# Patient Record
Sex: Female | Born: 1960 | Race: White | Hispanic: No | Smoking: Former smoker
Health system: Southern US, Community
[De-identification: ages and names within clinical notes are randomized; demographics above are authoritative.]

## PROBLEM LIST (undated history)

## (undated) HISTORY — PX: TUBAL LIGATION: SHX77

## (undated) HISTORY — PX: VARICOSE VEIN SURGERY: SHX832

---

## 2008-08-27 ENCOUNTER — Ambulatory Visit: Payer: Self-pay | Admitting: Family Medicine

## 2008-08-27 DIAGNOSIS — S058X9A Other injuries of unspecified eye and orbit, initial encounter: Secondary | ICD-10-CM | POA: Insufficient documentation

## 2010-10-12 ENCOUNTER — Encounter: Payer: Self-pay | Admitting: Family Medicine

## 2010-10-17 ENCOUNTER — Ambulatory Visit (INDEPENDENT_AMBULATORY_CARE_PROVIDER_SITE_OTHER): Payer: 59 | Admitting: Family Medicine

## 2010-10-17 ENCOUNTER — Encounter: Payer: Self-pay | Admitting: Family Medicine

## 2010-10-17 DIAGNOSIS — F419 Anxiety disorder, unspecified: Secondary | ICD-10-CM | POA: Insufficient documentation

## 2010-10-17 DIAGNOSIS — Z1211 Encounter for screening for malignant neoplasm of colon: Secondary | ICD-10-CM

## 2010-10-17 DIAGNOSIS — N951 Menopausal and female climacteric states: Secondary | ICD-10-CM

## 2010-10-17 DIAGNOSIS — Z1231 Encounter for screening mammogram for malignant neoplasm of breast: Secondary | ICD-10-CM

## 2010-10-17 DIAGNOSIS — F411 Generalized anxiety disorder: Secondary | ICD-10-CM

## 2010-10-17 MED ORDER — ALPRAZOLAM 0.25 MG PO TABS: 0.2500 mg | ORAL_TABLET | Freq: Every day | ORAL | Status: DC | PRN

## 2010-10-17 NOTE — Patient Instructions (Signed)
Please schedule your physical and pap smear.

## 2010-10-17 NOTE — Progress Notes (Signed)
Subjective:    Patient ID: Briana Castillo, female    DOB: April 23, 1961, 50 y.o.   MRN: [id number]  HPI Had 3 periods in a months time but now no period hasn't happened in about 5 weeks.  Having some nightsweats for months.  These are more frequent. Now getting hotflashes during the day.  Mood swings, irritability. No vaginal dryness. Sleep well. Not interested HRT. She just wants to quit having her period. She also would like to schedule physical with Pap smear. She has not had a Pap smear in the last 5 years. She has not had a mammogram since age 64. She has never had colon cancer screening.  Anxiety. She has had a prescription for Xanax in the past which she uses when necessary. She is a very small quantity about a dozen had lost her last year. She wonders if she could get a refill on this today.   Review of Systems  Constitutional: Negative for fever, diaphoresis and unexpected weight change.  HENT: Negative for hearing loss, rhinorrhea and tinnitus.   Eyes: Negative for visual disturbance.  Respiratory: Negative for cough and wheezing.   Cardiovascular: Negative for chest pain and palpitations.  Gastrointestinal: Negative for nausea, vomiting, diarrhea and blood in stool.  Genitourinary: Negative for vaginal bleeding, vaginal discharge and difficulty urinating.       Leaking urine  Musculoskeletal: Negative for myalgias and arthralgias.  Skin: Negative for rash.  Neurological: Negative for headaches.  Hematological: Negative for adenopathy. Does not bruise/bleed easily.  Psychiatric/Behavioral: Negative for sleep disturbance and dysphoric mood. The patient is nervous/anxious.    BP 128/82  Pulse 79  Ht 5\' 7"  (1.702 m)  Wt 160 lb (72.576 kg)  BMI 25.06 kg/m2    Allergies  Allergen Reactions  . Amoxicillin     REACTION: Swelling  . Penicillins     History reviewed. No pertinent past medical history.  Past Surgical History  Procedure Date  . Tubal ligation   . Varicose vein  surgery     History   Social History  . Marital Status: Divorced    Spouse Name: N/A    Number of Children: 1  . 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    Social History Main Topics  . Smoking status: Current Everyday Smoker -- 1.0 packs/day  . Smokeless tobacco: Not on file  . Alcohol Use: 0.0 oz/week    2-3 Glasses of wine per week  . Drug Use: No  . Sexually Active:      divorced, regular exercise.   Other Topics Concern  . Not on file   Social History Narrative  . No narrative on file    Family History  Problem Relation Age of Onset  . Diabetes      grandparent    Current outpatient prescriptions:ALPRAZolam (XANAX) 0.25 MG tablet, Take 1 tablet (0.25 mg total) by mouth daily as needed for anxiety., Disp: 15 tablet, Rfl: 0;  DISCONTD: sulfacetamide (BLEPH-10) 10 % ophthalmic solution, Apply 1 drop to eye every 3 (three) hours. 1-2 gtts every 3-4 hours to affected eye . , Disp: , Rfl: ;  DISCONTD: traMADol (ULTRAM) 50 MG tablet, Take 50 mg by mouth. 1-2 pills by mouth QHS as needed. For pain. , Disp: , Rfl:      Objective:   Physical Exam  Constitutional: She is oriented to person, place, and time. She appears well-developed and well-nourished.  HENT:  Head: Normocephalic and atraumatic.  Cardiovascular: Normal rate and regular rhythm.   Murmur heard.      2/6 SEM  Pulmonary/Chest: Effort normal and breath sounds normal.  Neurological: She is alert and oriented to person, place, and time.  Skin: Skin is warm and dry.  Psychiatric: She has a normal mood and affect.          Assessment & Plan:  I suspect she is probably becoming postmenopausal. She would be technically perimenopausal at this time. She is not currently interested in HRT. We did discuss the natural course of menopause.  I will make referrals for mammogram and for colonoscopy. She will come back and schedule for physical with Pap in the next  couple of weeks.

## 2010-10-17 NOTE — Assessment & Plan Note (Signed)
I did refill her Xanax for 15 tablets today. This at least last several months. I did warn her that if she finds she is using them more frequently and she needs to come in for further evaluation to see if she needs to be put on an SSRI.

## 2010-11-04 ENCOUNTER — Other Ambulatory Visit: Payer: 59

## 2010-11-04 ENCOUNTER — Telehealth: Payer: Self-pay | Admitting: *Deleted

## 2010-11-04 ENCOUNTER — Ambulatory Visit
Admission: RE | Admit: 2010-11-04 | Discharge: 2010-11-04 | Disposition: A | Discharge status: Home / Self Care | Payer: 59 | Source: Ambulatory Visit | Attending: Family Medicine | Admitting: Family Medicine

## 2010-11-04 DIAGNOSIS — Z0000 Encounter for general adult medical examination without abnormal findings: Secondary | ICD-10-CM

## 2010-11-04 DIAGNOSIS — Z1231 Encounter for screening mammogram for malignant neoplasm of breast: Secondary | ICD-10-CM

## 2010-11-04 NOTE — Telephone Encounter (Signed)
Pt came in this morning wanting lab. She is scheduled for aCPE

## 2010-11-05 LAB — LIPID PANEL
Cholesterol: 221 mg/dL — ABNORMAL HIGH (ref 0–200)
HDL: 87 mg/dL (ref >39)
LDL Cholesterol: 121 mg/dL — ABNORMAL HIGH (ref 0–99)
Total CHOL/HDL Ratio: 2.5 Ratio
Triglycerides: 64 mg/dL (ref <150)
VLDL: 13 mg/dL (ref 0–40)

## 2010-11-05 LAB — COMPLETE METABOLIC PANEL WITH GFR
ALT: 19 U/L (ref 0–35)
AST: 32 U/L (ref 0–37)
Alkaline Phosphatase: 52 U/L (ref 39–117)
BUN: 13 mg/dL (ref 6–23)
Chloride: 105 mEq/L (ref 96–112)
Creat: 0.92 mg/dL (ref 0.50–1.10)
Total Bilirubin: 0.6 mg/dL (ref 0.3–1.2)

## 2010-11-06 ENCOUNTER — Telehealth: Payer: Self-pay | Admitting: Family Medicine

## 2010-11-06 NOTE — Telephone Encounter (Signed)
Please call patient. Normal mammogram.  Repeat in 1 year.  

## 2010-11-06 NOTE — Telephone Encounter (Signed)
Pt notified that mammo normal and to repeat in 1 year.  Had to [facility]. Jarvis Newcomer, LPN [facility]

## 2010-11-12 ENCOUNTER — Telehealth: Payer: Self-pay | Admitting: Family Medicine

## 2010-11-12 NOTE — Telephone Encounter (Signed)
Call pt: CMP nl.  Chol is up a little. LDL is 121, goal is under 161. Just make sure eating low fat health diet and getting regular exercise and recheck in 1 year.

## 2010-11-13 ENCOUNTER — Encounter: Payer: Self-pay | Admitting: Family Medicine

## 2010-11-13 ENCOUNTER — Other Ambulatory Visit (HOSPITAL_COMMUNITY)
Admission: RE | Admit: 2010-11-13 | Discharge: 2010-11-13 | Disposition: A | Discharge status: Home / Self Care | Payer: 59 | Source: Ambulatory Visit | Attending: Family Medicine | Admitting: Family Medicine

## 2010-11-13 ENCOUNTER — Ambulatory Visit (INDEPENDENT_AMBULATORY_CARE_PROVIDER_SITE_OTHER): Payer: 59 | Admitting: Family Medicine

## 2010-11-13 VITALS — BP 130/85 | HR 87 | Ht 67.0 in | Wt 155.0 lb

## 2010-11-13 DIAGNOSIS — F411 Generalized anxiety disorder: Secondary | ICD-10-CM

## 2010-11-13 DIAGNOSIS — F419 Anxiety disorder, unspecified: Secondary | ICD-10-CM

## 2010-11-13 DIAGNOSIS — Z23 Encounter for immunization: Secondary | ICD-10-CM

## 2010-11-13 DIAGNOSIS — Z124 Encounter for screening for malignant neoplasm of cervix: Secondary | ICD-10-CM | POA: Insufficient documentation

## 2010-11-13 DIAGNOSIS — Z01419 Encounter for gynecological examination (general) (routine) without abnormal findings: Secondary | ICD-10-CM

## 2010-11-13 DIAGNOSIS — R87810 Cervical high risk human papillomavirus (HPV) DNA test positive: Secondary | ICD-10-CM | POA: Insufficient documentation

## 2010-11-13 DIAGNOSIS — R011 Cardiac murmur, unspecified: Secondary | ICD-10-CM

## 2010-11-13 MED ORDER — ALPRAZOLAM 0.5 MG PO TABS: 0.5000 mg | ORAL_TABLET | Freq: Every day | ORAL | Status: DC | PRN

## 2010-11-13 NOTE — Telephone Encounter (Signed)
Advised pt of result

## 2010-11-13 NOTE — Progress Notes (Signed)
  Subjective:     Briana Castillo is a 50 y.o. female and is here for a comprehensive physical exam. The patient reports no problems.  History   Social History  . Marital Status: Divorced    Spouse Name: N/A    Number of Children: 1  . 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    Social History Main Topics  . Smoking status: Current Everyday Smoker -- 1.0 packs/day    Types: Cigarettes  . Smokeless tobacco: Not on file  . Alcohol Use: 4.8 oz/week    8 Glasses of wine per week  . Drug Use: No  . Sexually Active: Not on file     divorced, regular exercise.   Other Topics Concern  . Not on file   Social History Narrative   She drinks 2-3 caffeinated beverages daily. Regular exercise.   Health Maintenance  Topic Date Due  . Pap Smear  10/10/1977  . Tetanus/tdap  10/11/1978  . Colonoscopy  10/10/2009  . Influenza Vaccine  03/02/2011  . Mammogram  11/03/2012    The following portions of the patient's history were reviewed and updated as appropriate: allergies, current medications, past family history, past medical history, past social history and past surgical history.  Review of Systems Pertinent items are noted in HPI.   Objective:    BP 130/85  Pulse 87  Ht 5\' 7"  (1.702 m)  Wt 155 lb (70.308 kg)  BMI 24.28 kg/m2 General appearance: alert, cooperative and appears stated age Head: Normocephalic, without obvious abnormality, atraumatic Eyes: conjunctiva clear, PEERLA, EOMi. Ears: normal TM's and external ear canals both ears Nose: Nares normal. Septum midline. Mucosa normal. No drainage or sinus tenderness. Throat: lips, mucosa, and tongue normal; teeth and gums normal Neck: no adenopathy, no carotid bruit, supple, symmetrical, trachea midline and thyroid not enlarged, symmetric, no tenderness/mass/nodules Back: symmetric, no curvature. ROM normal. No CVA tenderness. Lungs: clear to auscultation bilaterally Breasts: normal  appearance, no masses or tenderness Heart: systolic murmur: best herd on the right sternal border.  3/6, harsh  Abdomen: soft, non-tender; bowel sounds normal; no masses,  no organomegaly Pelvic: cervix normal in appearance, external genitalia normal, no adnexal masses or tenderness, no cervical motion tenderness, rectovaginal septum normal, uterus normal size, shape, and consistency and vagina normal without discharge Extremities: extremities normal, atraumatic, no cyanosis or edema Pulses: 2+ and symmetric Skin: Skin color, texture, turgor normal. No rashes or lesions Lymph nodes: Cervical, supraclavicular, and axillary nodes normal. Neurologic: Grossly normal    Assessment:    Healthy female exam.      Plan:     See After Visit Summary for Counseling Recommendations  Keep up the regular exercise and healthy diet.  She will schedule her colonoscopy. She has had her mammogram set up.  Tdap given today.  I reviewed her lab results with her and gave her a copy.

## 2010-11-13 NOTE — Assessment & Plan Note (Signed)
I did discuss with her that Xanax is a rescue medication should only be used when necessary. I also let her know that she finds she is using them more frequently than she needs to make an office visit as we may discuss a more prophylactic medication such as an SSRI. Patient reports that she did take citalopram were either Effexor in the past she is not sure which. She said she didn't like how she felt when she came off of the medication. I reassured her that yes these medications you need to be weaned when they were discontinued but overall they can be very helpful and don't cause such dependency benzodiazepines will do. I did refill her medications today for a 0.5 mg tablet she can break in half and use when necessary.

## 2010-11-13 NOTE — Progress Notes (Deleted)
  Subjective:    Patient ID: Briana Castillo, female    DOB: 1961/03/12, 50 y.o.   MRN: [id number]  HPI  She has the referrral for her colonoscopy.   Review of Systems     BP 130/85  Pulse 87  Ht 5\' 7"  (1.702 m)  Wt 155 lb (70.308 kg)  BMI 24.28 kg/m2    Allergies  Allergen Reactions  . Amoxicillin     REACTION: Swelling  . Penicillins     No past medical history on file.  Past Surgical History  Procedure Date  . Tubal ligation   . Varicose vein surgery     History   Social History  . Marital Status: Divorced    Spouse Name: N/A    Number of Children: 1  . 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    Social History Main Topics  . Smoking status: Current Everyday Smoker -- 1.0 packs/day    Types: Cigarettes  . Smokeless tobacco: Not on file  . Alcohol Use: 4.8 oz/week    8 Glasses of wine per week  . Drug Use: No  . Sexually Active: Not on file     divorced, regular exercise.   Other Topics Concern  . Not on file   Social History Narrative   She drinks 2-3 caffeinated beverages daily. Regular exercise.    Family History  Problem Relation Age of Onset  . Diabetes Maternal Grandmother   . Heart failure Father 78    Deceased    Current outpatient prescriptions:ALPRAZolam (XANAX) 0.25 MG tablet, Take 1 tablet (0.25 mg total) by mouth daily as needed for anxiety., Disp: 15 tablet, Rfl: 0  Objective:   Physical Exam  Constitutional: She appears well-developed and well-nourished.  HENT:  Head: Normocephalic and atraumatic.  Right Ear: External ear normal.  Mouth/Throat: Oropharynx is clear and moist.          Assessment & Plan:

## 2010-11-13 NOTE — Progress Notes (Signed)
  Subjective:    Patient ID: Briana Castillo, female    DOB: Mar 18, 1961, 50 y.o.   MRN: [id number]  HPI She would also like to get a 90 day supply on her Xanax. She would like me to increase the dose that she can use a half of a tab when necessary. At last time I gave her 15 tablets and she said that these were too small to cut in half. She says she is not using them daily. She only uses them when she feels overwhelmed.  She would like another something over-the-counter or other supplement that she can take for her menopausal symptoms.   Review of Systems     Objective:   Physical Exam        Assessment & Plan:  Menopausal symptoms-I recommended black cohosh evening primrose oil. If this is not enough then we could certainly consider an SSRI or Effexor to help control her hot flashes and her mood and irritability changes. She says she is not interested in a prescription medication at this time.

## 2010-11-13 NOTE — Progress Notes (Signed)
  Subjective:    Patient ID: Briana Castillo, female    DOB: 15-Nov-1960, 50 y.o.   MRN: [id number]  HPI    Review of Systems     Objective:   Physical Exam    corrections a physical exam. She does have a 2-3/6 systolic ejection murmur best heard at the right sternal border.    Assessment & Plan:

## 2010-11-20 ENCOUNTER — Telehealth: Payer: Self-pay | Admitting: Family Medicine

## 2010-11-20 NOTE — Telephone Encounter (Signed)
Call pt: normal pap.  Repeat in 2 years.

## 2010-11-21 NOTE — Telephone Encounter (Signed)
Pt informed of normal pap smear.  Told to repeat in 2 yrs. Jarvis Newcomer, LPN [facility]

## 2011-02-20 LAB — HM COLONOSCOPY

## 2011-06-30 ENCOUNTER — Ambulatory Visit (INDEPENDENT_AMBULATORY_CARE_PROVIDER_SITE_OTHER): Payer: BC Managed Care – PPO | Admitting: Family Medicine

## 2011-06-30 ENCOUNTER — Encounter: Payer: Self-pay | Admitting: Family Medicine

## 2011-06-30 VITALS — BP 145/81 | HR 70 | Temp 98.2°F | Wt 161.0 lb

## 2011-06-30 DIAGNOSIS — N951 Menopausal and female climacteric states: Secondary | ICD-10-CM

## 2011-06-30 DIAGNOSIS — F411 Generalized anxiety disorder: Secondary | ICD-10-CM

## 2011-06-30 DIAGNOSIS — R011 Cardiac murmur, unspecified: Secondary | ICD-10-CM

## 2011-06-30 DIAGNOSIS — F419 Anxiety disorder, unspecified: Secondary | ICD-10-CM

## 2011-06-30 DIAGNOSIS — I781 Nevus, non-neoplastic: Secondary | ICD-10-CM

## 2011-06-30 MED ORDER — VENLAFAXINE HCL ER 37.5 MG PO CP24: 37.5000 mg | ORAL_CAPSULE | Freq: Every day | ORAL | Status: DC

## 2011-06-30 MED ORDER — ALPRAZOLAM 0.5 MG PO TABS: 0.5000 mg | ORAL_TABLET | Freq: Every day | ORAL | Status: DC | PRN

## 2011-06-30 NOTE — Progress Notes (Signed)
Subjective:    Patient ID: Briana Castillo, female    DOB: 01-Apr-1961, 51 y.o.   MRN: [id number]  HPI Here to discuss menopause. She has been having night sweats and mood swings and irregular periods. They have been heavier and more frequent over the last 2 years. She denies any daytime hot flashes. She feels her sleep is fair except for what is may be disrupted by the night sweats which is not every night. She has a positive history of tobacco use. She's been currently using black cohosh to help with her symptoms. She says it helps some. She does workout regularly which is good. She also notes an inability to focus.  Spider veins-18 years ago she had vein stripping and sclerotherapy. In the last year she's noticed more discomfort and aching and swelling in her lower legs. She doesn't occasional cramping. She is not currently using wearing compression stockings. She also notes occasional stinging pain. No worsening or alleviating symptoms at this time.  Murmur-she says she was seen at an urgent care about a month ago and was told that they recommended she see cardiology. When she first came to me we detect a murmur but she says it had been there for some time and that her previous physician had noted the same thing. She says she's never had an evaluation her echocardiogram for the murmur. She denies any chest pain or shortness of breath or family history of abnormal valve diseases.  Anxiety-she continues to use her Xanax when necessary, not daily basis and is doing well. She would like a refill.  Review of Systems BP 145/81  Pulse 70  Temp(Src) 98.2 F (36.8 C) (Oral)  Wt 161 lb (73.029 kg)  SpO2 97%    Allergies  Allergen Reactions  . Amoxicillin     REACTION: Swelling  . Penicillins     No past medical history on file.  Past Surgical History  Procedure Date  . Tubal ligation   . Varicose vein surgery     History   Social History  . Marital Status: Divorced    Spouse Name: N/A    Number of Children: 1  . 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    Social History Main Topics  . Smoking status: Current Everyday Smoker -- 1.0 packs/day    Types: Cigarettes  . Smokeless tobacco: Not on file  . Alcohol Use: 4.8 oz/week    8 Glasses of wine per week  . Drug Use: No  . Sexually Active: Not on file     divorced, regular exercise.   Other Topics Concern  . Not on file   Social History Narrative   She drinks 2-3 caffeinated beverages daily. Regular exercise.    Family History  Problem Relation Age of Onset  . Diabetes Maternal Grandmother   . Heart failure Father 42    Deceased       Objective:   Physical Exam  Constitutional: She is oriented to person, place, and time. She appears well-developed and well-nourished.  HENT:  Head: Normocephalic and atraumatic.  Cardiovascular: Normal rate and regular rhythm.        4/6 systolic ejection murmur best heard at the right midsternal border.  Pulmonary/Chest: Effort normal and breath sounds normal.  Neurological: She is alert and oriented to person, place, and time.  Skin: Skin is warm and dry.       She does have a lot  of spider veins from her mid tibia down to her ankles on both legs. Some trace edema. A few varicose veins. None are bulging.   Psychiatric: She has a normal mood and affect. Her behavior is normal.          Assessment & Plan:  Perimenopausal symptoms-we discussed different options which includes doing nothing, versus over-the-counter treatment such as black cohosh and evening primrose oil, versus prescription medications including HRT and non-HRT products. She is at high risk because of her tobacco abuse with blood clots with HRT. We discussed another product such as Effexor may be beneficial for her to help control her symptoms. She is already using the over-the-counter black cohosh. I discussed that it will help with some of her menopausal  symptoms but not with her frequent heavy periods. I did recommend that she consider seeing her GYN to discuss this. She is over 35 she might warrant an endometrial biopsy to rule out endometrial hyperplasia. After discussing the options she did decide she wants to start the Effexor. We discussed the potential side effects. Follow up in one month. She says she knows she needs to quit smoking and has been working on weaning down.  Spider veins-the refer her to the [facility] in [place]. I discussed the typical treatment is typically wearing compression stockings for some time to see if it improves her symptoms and also to help prevent future varicose veins and spider veins. She would like to wait so she has her first appointment with them.  Systolic ejection murmur-because it seems louder today than when I last saw her I do recommend evaluation with an echocardiogram. Depending on the results she may need to see a cardiologist. Patient is somewhat concerned about the cost because she has a high deductible this year. Next  Elevated blood pressure-her blood pressure is up a little bit today. She is coming back in 1 month we will recheck it then.

## 2011-07-09 ENCOUNTER — Other Ambulatory Visit (HOSPITAL_COMMUNITY): Payer: BC Managed Care – PPO | Admitting: Radiology

## 2011-08-11 DIAGNOSIS — I83819 Varicose veins of unspecified lower extremities with pain: Secondary | ICD-10-CM | POA: Insufficient documentation

## 2011-12-08 DIAGNOSIS — I872 Venous insufficiency (chronic) (peripheral): Secondary | ICD-10-CM | POA: Insufficient documentation

## 2012-01-13 ENCOUNTER — Encounter: Payer: Self-pay | Admitting: Family Medicine

## 2012-01-13 ENCOUNTER — Other Ambulatory Visit (HOSPITAL_COMMUNITY)
Admission: RE | Admit: 2012-01-13 | Discharge: 2012-01-13 | Disposition: A | Discharge status: Home / Self Care | Payer: BC Managed Care – PPO | Source: Ambulatory Visit | Attending: Family Medicine | Admitting: Family Medicine

## 2012-01-13 ENCOUNTER — Ambulatory Visit (INDEPENDENT_AMBULATORY_CARE_PROVIDER_SITE_OTHER): Payer: BC Managed Care – PPO | Admitting: Family Medicine

## 2012-01-13 VITALS — BP 143/95 | HR 74 | Ht 67.0 in | Wt 171.0 lb

## 2012-01-13 DIAGNOSIS — Z1231 Encounter for screening mammogram for malignant neoplasm of breast: Secondary | ICD-10-CM

## 2012-01-13 DIAGNOSIS — Z720 Tobacco use: Secondary | ICD-10-CM

## 2012-01-13 DIAGNOSIS — F17200 Nicotine dependence, unspecified, uncomplicated: Secondary | ICD-10-CM

## 2012-01-13 DIAGNOSIS — Z23 Encounter for immunization: Secondary | ICD-10-CM

## 2012-01-13 DIAGNOSIS — Z01419 Encounter for gynecological examination (general) (routine) without abnormal findings: Secondary | ICD-10-CM | POA: Insufficient documentation

## 2012-01-13 LAB — LIPID PANEL
LDL Cholesterol: 99 mg/dL (ref 0–99)
Triglycerides: 75 mg/dL (ref <150)
VLDL: 15 mg/dL (ref 0–40)

## 2012-01-13 LAB — COMPLETE METABOLIC PANEL WITH GFR
ALT: 15 U/L (ref 0–35)
AST: 20 U/L (ref 0–37)
Albumin: 4 g/dL (ref 3.5–5.2)
CO2: 28 mEq/L (ref 19–32)
Calcium: 9.1 mg/dL (ref 8.4–10.5)
Chloride: 107 mEq/L (ref 96–112)
Creat: 0.83 mg/dL (ref 0.50–1.10)
GFR, Est African American: >89 mL/min
Potassium: 4.5 mEq/L (ref 3.5–5.3)

## 2012-01-13 MED ORDER — ALPRAZOLAM 0.5 MG PO TABS: 0.5000 mg | ORAL_TABLET | Freq: Every day | ORAL | Status: DC | PRN

## 2012-01-13 NOTE — Patient Instructions (Addendum)
Start a regular exercise program and make sure you are eating a healthy diet Try to eat 4 servings of dairy a day  Your vaccines are up to date.   

## 2012-01-13 NOTE — Progress Notes (Signed)
Subjective:     Briana Castillo is a 51 y.o. female and is here for a comprehensive physical exam. The patient reports problems - Says her last period was 4 months ago and knows she is post menopausal. Says she is irritable, has gained weight, having difficouly focusing, low energy.  Marland Kitchen  History   Social History  . Marital Status: Divorced    Spouse Name: N/A    Number of Children: 1  . 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    Social History Main Topics  . Smoking status: Current Everyday Smoker -- 1.0 packs/day    Types: Cigarettes  . Smokeless tobacco: Not on file  . Alcohol Use: 4.8 oz/week    8 Glasses of wine per week  . Drug Use: No  . Sexually Active: Not on file     divorced, regular exercise.   Other Topics Concern  . Not on file   Social History Narrative   She drinks 2-3 caffeinated beverages daily. Regular exercise.   Health Maintenance  Topic Date Due  . Influenza Vaccine  03/01/2012  . Mammogram  11/03/2012  . Pap Smear  01/12/2014  . Tetanus/tdap  11/12/2020  . Colonoscopy  01/30/2021    The following portions of the patient's history were reviewed and updated as appropriate: allergies, current medications, past family history, past medical history, past social history, past surgical history and problem list.  Review of Systems A comprehensive review of systems was negative.   Objective:    BP 143/95  Pulse 74  Ht 5\' 7"  (1.702 m)  Wt 171 lb (77.565 kg)  BMI 26.78 kg/m2  SpO2 98%  LMP 09/13/2011 General appearance: alert, cooperative and appears stated age Head: Normocephalic, without obvious abnormality, atraumatic Eyes: conj clear, EOMI, PEERLA Ears: normal TM's and external ear canals both ears Nose: Nares normal. Septum midline. Mucosa normal. No drainage or sinus tenderness. Throat: lips, mucosa, and tongue normal; teeth and gums normal Neck: no adenopathy, no carotid bruit, supple, symmetrical,  trachea midline and thyroid not enlarged, symmetric, no tenderness/mass/nodules Back: symmetric, no curvature. ROM normal. No CVA tenderness. Lungs: clear to auscultation bilaterally Breasts: normal appearance, no masses or tenderness Heart: regular rate and rhythm, S1, S2 normal, no murmur, click, rub or gallop Abdomen: soft, non-tender; bowel sounds normal; no masses,  no organomegaly Pelvic: cervix normal in appearance, external genitalia normal, no adnexal masses or tenderness, no cervical motion tenderness, rectovaginal septum normal, uterus normal size, shape, and consistency and vagina normal without discharge Extremities: extremities normal, atraumatic, no cyanosis or edema Pulses: 2+ and symmetric Skin: Skin color, texture, turgor normal. No rashes or lesions Lymph nodes: Cervical, supraclavicular, and axillary nodes normal. Neurologic: Grossly normal    Assessment:    Healthy female exam.      Plan:     See After Visit Summary for Counseling Recommendations  Start a regular exercise program and make sure you are eating a healthy diet Try to eat 4 servings of dairy a day  Your vaccines are up to date Due for screening labs.  Menopause - she never actually started the Effexor. We discussed again. She's not a candidate for hormone replacement therapy because she's a smoker. She says she will think about it. I did encourage her to followup if she decides to start it.  Tobacco abuse-discussed cessation. Handout given on tips. She know she needs to quit but she is not quite  ready.  Pneumonia vaccine given.  Discussed wellbutrin vx chantix.  She wants to read more about them and see what she wants to do.

## 2012-01-22 ENCOUNTER — Ambulatory Visit (HOSPITAL_BASED_OUTPATIENT_CLINIC_OR_DEPARTMENT_OTHER)
Admission: RE | Admit: 2012-01-22 | Discharge: 2012-01-22 | Disposition: A | Discharge status: Home / Self Care | Payer: BC Managed Care – PPO | Source: Ambulatory Visit | Attending: Family Medicine | Admitting: Family Medicine

## 2012-01-22 DIAGNOSIS — Z1231 Encounter for screening mammogram for malignant neoplasm of breast: Secondary | ICD-10-CM | POA: Insufficient documentation

## 2012-04-22 ENCOUNTER — Other Ambulatory Visit: Payer: Self-pay | Admitting: *Deleted

## 2012-04-22 ENCOUNTER — Other Ambulatory Visit: Payer: Self-pay | Admitting: Family Medicine

## 2012-06-02 ENCOUNTER — Other Ambulatory Visit: Payer: Self-pay | Admitting: Family Medicine

## 2012-09-27 ENCOUNTER — Other Ambulatory Visit: Payer: Self-pay | Admitting: *Deleted

## 2012-09-27 ENCOUNTER — Other Ambulatory Visit: Payer: Self-pay | Admitting: Family Medicine

## 2012-09-27 MED ORDER — ALPRAZOLAM 0.5 MG PO TABS: 0.5000 mg | ORAL_TABLET | Freq: Every day | ORAL | Status: DC | PRN

## 2013-01-13 ENCOUNTER — Encounter: Payer: Self-pay | Admitting: Family Medicine

## 2013-01-13 ENCOUNTER — Ambulatory Visit (INDEPENDENT_AMBULATORY_CARE_PROVIDER_SITE_OTHER): Payer: BC Managed Care – PPO | Admitting: Family Medicine

## 2013-01-13 VITALS — BP 136/89 | HR 72 | Ht 67.0 in | Wt 187.0 lb

## 2013-01-13 DIAGNOSIS — R011 Cardiac murmur, unspecified: Secondary | ICD-10-CM

## 2013-01-13 DIAGNOSIS — H938X9 Other specified disorders of ear, unspecified ear: Secondary | ICD-10-CM

## 2013-01-13 DIAGNOSIS — R5383 Other fatigue: Secondary | ICD-10-CM

## 2013-01-13 DIAGNOSIS — H938X1 Other specified disorders of right ear: Secondary | ICD-10-CM

## 2013-01-13 DIAGNOSIS — Z1231 Encounter for screening mammogram for malignant neoplasm of breast: Secondary | ICD-10-CM

## 2013-01-13 DIAGNOSIS — R5381 Other malaise: Secondary | ICD-10-CM

## 2013-01-13 DIAGNOSIS — Z0000 Encounter for general adult medical examination without abnormal findings: Secondary | ICD-10-CM

## 2013-01-13 MED ORDER — ALPRAZOLAM 0.5 MG PO TABS: 0.5000 mg | ORAL_TABLET | Freq: Every day | ORAL | Status: DC | PRN

## 2013-01-13 NOTE — Progress Notes (Signed)
Subjective:     Briana Castillo is a 52 y.o. female and is here for a comprehensive physical exam. The patient reports problems - Says she has gained 30 lbs in the last year.  Her last periods was 1.5 years ago. She is now menopausal. Has low energy and lack of focus.  Starting having floaters in her left eye backin teh spring. Went to eye doctor and told eye exam was normal.    Pressure on and off in her right ear. No pain or discharge but pressure.  No hearing loss. No trauma.    Wants to discuss weigh tloss medications. She has several friends who've been doing injections and hasn't really noticed some significant results with weight loss. She's interested in discussing this today. She has been trying to take some B12 sublingual to see if this would help. Admits she has not been exercising at all. She says she just feels too fatigued and unmotivated. She says sometimes she'll even dropped to the gym, parking, and then turn around and not go inside.   She also complains of bilateral heel pain. She has a history of heel spurs. She had injections in about 8-9 years ago responded really well to this. The pain started up again about a year ago. She's been trying to just do conservative therapy with stretches, and confirmatory, icing the area. She was having which is eventually get better. It has not. She is thinking about doing the injections again. She says now she's also experiencing knee pain and think she may have a lot of swelling in both knees. She's not sure if it's from the weight gain or something else. She says now she's also having pain in the ball of her foot.  History   Social History  . Marital Status: Divorced    Spouse Name: N/A    Number of Children: 1  . 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    Social History Main Topics  . Smoking status: Current Every Day Smoker -- 1.00 packs/day    Types: Cigarettes  . Smokeless tobacco:  Not on file  . Alcohol Use: 4.8 oz/week    8 Glasses of wine per week  . Drug Use: No  . Sexual Activity: Not on file     Comment: divorced, regular exercise.   Other Topics Concern  . Not on file   Social History Narrative   She drinks 2-3 caffeinated beverages daily. Regular exercise.   Health Maintenance  Topic Date Due  . Influenza Vaccine  01/30/2013  . Pap Smear  01/12/2014  . Mammogram  01/21/2014  . Tetanus/tdap  11/12/2020  . Colonoscopy  01/30/2021    The following portions of the patient's history were reviewed and updated as appropriate: allergies, current medications, past family history, past medical history, past social history, past surgical history and problem list.  Review of Systems A comprehensive review of systems was negative.   Objective:    BP 136/89  Pulse 72  Ht 5\' 7"  (1.702 m)  Wt 187 lb (84.823 kg)  BMI 29.28 kg/m2 General appearance: alert, cooperative and appears stated age Head: Normocephalic, without obvious abnormality, atraumatic Eyes: conj clear, EOMi, PEERLA Ears: normal TM's and external ear canals both ears Nose: Nares normal. Septum midline. Mucosa normal. No drainage or sinus tenderness. Throat: lips, mucosa, and tongue normal; teeth and gums normal Neck: no adenopathy, no carotid bruit, no JVD, supple, symmetrical,  trachea midline and thyroid not enlarged, symmetric, no tenderness/mass/nodules Back: symmetric, no curvature. ROM normal. No CVA tenderness. Lungs: clear to auscultation bilaterally Breasts: normal appearance, no masses or tenderness Heart: regular rate and rhythm and systolic murmur: holosystolic 3/6, right sternal border radiates to carotids Abdomen: soft, non-tender; bowel sounds normal; no masses,  no organomegaly Extremities: extremities normal, atraumatic, no cyanosis or edema Pulses: 2+ and symmetric Skin: Skin color, texture, turgor normal. No rashes or lesions Lymph nodes: Cervical, supraclavicular, and  axillary nodes normal. Neurologic: Alert and oriented X 3, normal strength and tone. Normal symmetric reflexes. Normal coordination and gait    Assessment:    Healthy female exam.      Plan:     See After Visit Summary for Counseling Recommendations  Keep up a regular exercise program and make sure you are eating a healthy diet Try to eat 4 servings of dairy a day, or if you are lactose intolerant take a calcium with vitamin D daily.  Your vaccines are up to date.   Right TM retracted - tympanogram performed. There was a high peak height on the tympanogram on the right year. Thus I would like to refer to ENT for further evaluation.  Obesity -we discussed different treatment options. Of course diet and exercise are absolutely key. She says she knows what she should do as far as diet is concerned. She just feels unmotivated as far as exercise. I will check a thyroid level just to rule hypothyroidism since she has gained a large amount of weight and a short period time. I discussed with her that we do not do hCG injections currently.  Bilat foot pain - history of bilateral heel spurs. Since she has failed conservative therapy at this point I recommend she see my partner Dr. Maisie Fus. For now continue stetches, NSAIDs, icing.   [facility] for further evaluation and treatment. I think she would benefit greatly from injections and probably custom orthotics.  Bilateral knee pain-my partner Dr. Rodney Langton can take a look at this as well. I do think she has some trace swelling in the left knee.

## 2013-01-17 LAB — VITAMIN B12: Vitamin B-12: 1664 pg/mL — ABNORMAL HIGH (ref 211–911)

## 2013-01-17 LAB — COMPLETE METABOLIC PANEL WITH GFR
ALT: 20 U/L (ref 0–35)
CO2: 25 mEq/L (ref 19–32)
Calcium: 9.2 mg/dL (ref 8.4–10.5)
Chloride: 108 mEq/L (ref 96–112)
GFR, Est African American: 90 mL/min
Sodium: 142 mEq/L (ref 135–145)
Total Protein: 6.6 g/dL (ref 6.0–8.3)

## 2013-01-17 LAB — LIPID PANEL
HDL: 77 mg/dL (ref >39)
LDL Cholesterol: 104 mg/dL — ABNORMAL HIGH (ref 0–99)

## 2013-01-17 LAB — CBC
HCT: 39.1 % (ref 36.0–46.0)
Hemoglobin: 13.4 g/dL (ref 12.0–15.0)
RDW: 14.8 % (ref 11.5–15.5)
WBC: 6 10*3/uL (ref 4.0–10.5)

## 2013-01-17 LAB — FERRITIN: Ferritin: 31 ng/mL (ref 10–291)

## 2013-01-23 ENCOUNTER — Encounter: Payer: Self-pay | Admitting: Family Medicine

## 2013-01-23 ENCOUNTER — Ambulatory Visit (HOSPITAL_BASED_OUTPATIENT_CLINIC_OR_DEPARTMENT_OTHER)
Admission: RE | Admit: 2013-01-23 | Discharge: 2013-01-23 | Disposition: A | Discharge status: Home / Self Care | Payer: BC Managed Care – PPO | Source: Ambulatory Visit | Attending: Family Medicine | Admitting: Family Medicine

## 2013-01-23 DIAGNOSIS — Z1231 Encounter for screening mammogram for malignant neoplasm of breast: Secondary | ICD-10-CM | POA: Insufficient documentation

## 2013-02-03 ENCOUNTER — Other Ambulatory Visit: Payer: Self-pay | Admitting: Family Medicine

## 2013-02-03 ENCOUNTER — Telehealth: Payer: Self-pay | Admitting: *Deleted

## 2013-02-03 DIAGNOSIS — R635 Abnormal weight gain: Secondary | ICD-10-CM

## 2013-02-03 DIAGNOSIS — E039 Hypothyroidism, unspecified: Secondary | ICD-10-CM

## 2013-02-03 NOTE — Telephone Encounter (Signed)
2D echo prior auth # [id number]. [facility] notified. Barry Dienes, LPN

## 2013-02-19 ENCOUNTER — Telehealth: Payer: Self-pay | Admitting: Family Medicine

## 2013-02-19 NOTE — Telephone Encounter (Signed)
Call pt: I have her echo results back. Overall things are ok but I want to go over results with her. Have her schedule appt

## 2013-02-21 NOTE — Telephone Encounter (Signed)
Pt informed she will call for an appt next week.Loralee Pacas Lykens

## 2013-02-23 ENCOUNTER — Ambulatory Visit (INDEPENDENT_AMBULATORY_CARE_PROVIDER_SITE_OTHER): Payer: BC Managed Care – PPO | Admitting: Family Medicine

## 2013-02-23 ENCOUNTER — Encounter: Payer: Self-pay | Admitting: Family Medicine

## 2013-02-23 VITALS — BP 152/90 | HR 60 | Wt 186.0 lb

## 2013-02-23 DIAGNOSIS — R635 Abnormal weight gain: Secondary | ICD-10-CM

## 2013-02-23 DIAGNOSIS — M25561 Pain in right knee: Secondary | ICD-10-CM

## 2013-02-23 DIAGNOSIS — R011 Cardiac murmur, unspecified: Secondary | ICD-10-CM

## 2013-02-23 DIAGNOSIS — M25579 Pain in unspecified ankle and joints of unspecified foot: Secondary | ICD-10-CM

## 2013-02-23 DIAGNOSIS — M25569 Pain in unspecified knee: Secondary | ICD-10-CM

## 2013-02-23 MED ORDER — PHENTERMINE HCL 37.5 MG PO CAPS: 37.5000 mg | ORAL_CAPSULE | ORAL | Status: DC

## 2013-02-23 NOTE — Progress Notes (Signed)
Subjective:    Patient ID: Briana Castillo, female    DOB: 1960/11/19, 52 y.o.   MRN: [id number]  HPI Here to go over echo results.  She came in for a physical and wanted to discuss her weight gain. She gained about 30 pounds over the last year. I did hear a murmur on exam and it was hesitant about starting any medication until we had an echocardiogram to make sure that her heart was okay to potentially start a stimulant. TSH wasn't run on labs. Has been working out for 6 weeks adn has only lost 1 lb.  she has several friends that have been going to local weight loss clinic in taking phentermine as well as getting hCG injections. She asked me about this last time and I explained her that I do not do the hCG injections but with a we could consider phentermine as long as I knew that her heart was helping her blood pressure was under good control. She said she decided not to followup with Dr. to Dr. Benjamin Stain for her knee pain and foot pain because when she went online to read about her conditions she read that weight loss usually makes a significant improvement in these symptoms.   Review of Systems  BP 152/90  Pulse 60  Wt 186 lb (84.369 kg)  BMI 29.12 kg/m2    Allergies  Allergen Reactions  . Amoxicillin Shortness Of Breath and Rash    REACTION: Swelling  . Penicillins Shortness Of Breath and Rash    No past medical history on file.  Past Surgical History  Procedure Laterality Date  . Tubal ligation    . Varicose vein surgery      x 2    History   Social History  . Marital Status: Divorced    Spouse Name: N/A    Number of Children: 1  . 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    Social History Main Topics  . Smoking status: Current Every Day Smoker -- 1.00 packs/day    Types: Cigarettes  . Smokeless tobacco: Not on file  . Alcohol Use: 4.8 oz/week    8 Glasses of wine per week  . Drug Use: No  . Sexual Activity: Not on  file     Comment: divorced, regular exercise.   Other Topics Concern  . Not on file   Social History Narrative   She drinks 2-3 caffeinated beverages daily. Regular exercise.    Family History  Problem Relation Age of Onset  . Diabetes Maternal Grandmother   . Heart failure Father 27    Deceased    Outpatient Encounter Prescriptions as of 02/23/2013  Medication Sig Dispense Refill  . ALPRAZolam (XANAX) 0.5 MG tablet Take 1 tablet (0.5 mg total) by mouth daily as needed for anxiety.  45 tablet  0  . phentermine 37.5 MG capsule Take 1 capsule (37.5 mg total) by mouth every morning.  30 capsule  0   No facility-administered encounter medications on file as of 02/23/2013.          Objective:   Physical Exam  Constitutional: She appears well-developed and well-nourished.  Skin: Skin is warm and dry.  Psychiatric: She has a normal mood and affect. Her behavior is normal.          Assessment & Plan:  Abnormal weight gain - we discussed different options. I think as far as her heart is concerned  she is okay to take a stimulant as well as her blood pressures under good control. We discussed the potential side effects of the medication and she is to stop immediately if she expresses any chest pain or shortness of breath. She would need to followup monthly for blood pressure and weight checks. She says she will decide if she wants to do it here or if she wants to go to the local weight loss clinic. Unfortunately the thyroid level was never drawn. A lab slip did not have a bar code. I reprinted a new slip today and encouraged her to try to go today.  Bilateral knee pain/foot pain-I discussed that I still would encourage her to see my partner for further evaluation and treatment. I think if he can provide her some pain relief and that will allow her to exercise more normally which will in turn allow her to lose weight which is ultimately her goal.  Echo - reviewed echo results with her  and gave her a copy. She did have some mitral valve as well as tricuspid valve and aortic valve regurgitation but no other sign of significant disease ejection fraction was 60-65% which is in the normal range. Did discuss the importance of controlling blood pressure. Her pressure was a little bit elevated today but she was very worried and stressed about her results.  Time spent 25 min, >50% spent counseling about weight gain, heart murmur and knee pain/foot pain.

## 2013-02-27 ENCOUNTER — Telehealth: Payer: Self-pay | Admitting: *Deleted

## 2013-02-27 MED ORDER — PHENTERMINE HCL 37.5 MG PO CAPS: 37.5000 mg | ORAL_CAPSULE | ORAL | Status: DC

## 2013-02-27 NOTE — Telephone Encounter (Signed)
Pt has LM stating that she has lost the rx for the Phentermine you gave her at her appt. She is asking for another rx.  Meyer Cory, LPN

## 2013-02-27 NOTE — Telephone Encounter (Signed)
Just to verify with pharmacy that it was not filled. If it was not, then ok to resend new one.

## 2013-02-27 NOTE — Telephone Encounter (Signed)
[facility]. Reprinted rx.  Meyer Cory, LPN

## 2013-03-02 ENCOUNTER — Telehealth: Payer: Self-pay | Admitting: *Deleted

## 2013-03-02 ENCOUNTER — Other Ambulatory Visit: Payer: Self-pay | Admitting: *Deleted

## 2013-03-02 MED ORDER — PHENTERMINE HCL 37.5 MG PO TABS: 37.5000 mg | ORAL_TABLET | Freq: Every day | ORAL | Status: DC

## 2013-03-02 NOTE — Telephone Encounter (Signed)
It does come in a 37.5 mg tablet. She usually more expensive than the capsules but if she would like to have the tablets and over them that is fine. Okay to her resent prescription.

## 2013-03-02 NOTE — Telephone Encounter (Signed)
Pt left vm stating that you sent in capsules of phentermine but she prefers tablets that she can cut in half.  Are these available? If so can we send those in? Please advise

## 2013-04-18 DIAGNOSIS — I781 Nevus, non-neoplastic: Secondary | ICD-10-CM | POA: Insufficient documentation

## 2014-03-28 ENCOUNTER — Other Ambulatory Visit: Payer: Self-pay | Admitting: Family Medicine

## 2014-03-28 ENCOUNTER — Ambulatory Visit (INDEPENDENT_AMBULATORY_CARE_PROVIDER_SITE_OTHER): Payer: BC Managed Care – PPO

## 2014-03-28 ENCOUNTER — Encounter: Payer: Self-pay | Admitting: Family Medicine

## 2014-03-28 ENCOUNTER — Other Ambulatory Visit (HOSPITAL_COMMUNITY)
Admission: RE | Admit: 2014-03-28 | Discharge: 2014-03-28 | Disposition: A | Discharge status: Home / Self Care | Payer: BC Managed Care – PPO | Source: Ambulatory Visit | Attending: Family Medicine | Admitting: Family Medicine

## 2014-03-28 ENCOUNTER — Telehealth: Payer: Self-pay | Admitting: Family Medicine

## 2014-03-28 ENCOUNTER — Ambulatory Visit (INDEPENDENT_AMBULATORY_CARE_PROVIDER_SITE_OTHER): Payer: BC Managed Care – PPO | Admitting: Family Medicine

## 2014-03-28 VITALS — BP 138/94 | HR 72 | Ht 67.0 in | Wt 198.0 lb

## 2014-03-28 DIAGNOSIS — Z1151 Encounter for screening for human papillomavirus (HPV): Secondary | ICD-10-CM | POA: Insufficient documentation

## 2014-03-28 DIAGNOSIS — Z0000 Encounter for general adult medical examination without abnormal findings: Secondary | ICD-10-CM | POA: Diagnosis not present

## 2014-03-28 DIAGNOSIS — Z01419 Encounter for gynecological examination (general) (routine) without abnormal findings: Secondary | ICD-10-CM | POA: Insufficient documentation

## 2014-03-28 DIAGNOSIS — Z1231 Encounter for screening mammogram for malignant neoplasm of breast: Secondary | ICD-10-CM

## 2014-03-28 DIAGNOSIS — Z124 Encounter for screening for malignant neoplasm of cervix: Secondary | ICD-10-CM | POA: Diagnosis not present

## 2014-03-28 MED ORDER — ESTRADIOL-NORETHINDRONE ACET 0.5-0.1 MG PO TABS: 1.0000 | ORAL_TABLET | Freq: Every day | ORAL | Status: DC

## 2014-03-28 MED ORDER — ALPRAZOLAM 0.5 MG PO TABS: 0.5000 mg | ORAL_TABLET | Freq: Every day | ORAL | Status: DC | PRN

## 2014-03-28 NOTE — Progress Notes (Signed)
Subjective:     Briana Castillo is a 53 y.o. female and is here for a comprehensive physical exam. The patient reports problems - weight gain, hot flahses. She is 2 year s post menopausal and just feels miserable.  No sleeping well, Irritable. Marland Kitchen.  History   Social History  . Marital Status: Divorced    Spouse Name: N/A    Number of Children: 1  . 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    Social History Main Topics  . Smoking status: Current Some Day Smoker -- 1.00 packs/day    Types: Cigarettes  . Smokeless tobacco: Not on file  . Alcohol Use: 4.8 oz/week    8 Glasses of wine per week  . Drug Use: No  . Sexual Activity: Not on file     Comment: divorced, regular exercise.   Other Topics Concern  . Not on file   Social History Narrative   She drinks 2-3 caffeinated beverages daily. Regular exercise.   Health Maintenance  Topic Date Due  . Influenza Vaccine  12/30/2013  . Pap Smear  01/12/2014  . Mammogram  01/24/2015  . Tetanus/tdap  11/12/2020  . Colonoscopy  01/30/2021    The following portions of the patient's history were reviewed and updated as appropriate: allergies, current medications, past family history, past medical history, past social history, past surgical history and problem list.  Review of Systems A comprehensive review of systems was negative.   Objective:    BP 138/94  Pulse 72  Ht 5\' 7"  (1.702 m)  Wt 198 lb (89.812 kg)  BMI 31.00 kg/m2 General appearance: alert, cooperative and appears stated age Head: Normocephalic, without obvious abnormality, atraumatic Eyes: conj clear, EOMI, PEERLA Ears: normal TM's and external ear canals both ears Nose: Nares normal. Septum midline. Mucosa normal. No drainage or sinus tenderness. Throat: lips, mucosa, and tongue normal; teeth and gums normal Neck: no adenopathy, no carotid bruit, no JVD, supple, symmetrical, trachea midline and thyroid not enlarged,  symmetric, no tenderness/mass/nodules Back: symmetric, no curvature. ROM normal. No CVA tenderness. Lungs: clear to auscultation bilaterally Breasts: normal appearance, no masses or tenderness Heart: regular rate and rhythm, S1, S2 normal, no murmur, click, rub or gallop Abdomen: soft, non-tender; bowel sounds normal; no masses,  no organomegaly Pelvic: cervix normal in appearance, external genitalia normal, no adnexal masses or tenderness, no cervical motion tenderness, rectovaginal septum normal, uterus normal size, shape, and consistency and vagina normal without discharge Extremities: extremities normal, atraumatic, no cyanosis or edema Pulses: 2+ and symmetric Skin: Skin color, texture, turgor normal. No rashes or lesions Lymph nodes: Cervical, supraclavicular, and axillary nodes normal. Neurologic: Alert and oriented X 3, normal strength and tone. Normal symmetric reflexes. Normal coordination and gait    Assessment:    Healthy female exam.      Plan:     See After Visit Summary for Counseling Recommendations  Keep up a regular exercise program and make sure you are eating a healthy diet Try to eat 4 servings of dairy a day, or if you are lactose intolerant take a calcium with vitamin D daily.  Your vaccines are up to date.   HRT - we discussed the pros and cons of hormone replacement therapy. She does have a uterus that she would need to be on combination therapy. We discussed increased risk of breast cancer as well as heart disease. She has no family history of breast  cancer or significant for heart disease. She's never had a personal history of either of these. Discussed that if she does decide to use hormonal placement therapy should be for a short period time such as 2-3 years. And that should be weaned appropriately. The head and start medication. I will send a prescription to her pharmacy. Like to see her back in 6 months to make sure that she is doing well minutes relieving her  symptoms.

## 2014-03-28 NOTE — Patient Instructions (Signed)
Keep up a regular exercise program and make sure you are eating a healthy diet Try to eat 4 servings of dairy a day, or if you are lactose intolerant take a calcium with vitamin D daily.  Your vaccines are up to date.   

## 2014-03-28 NOTE — Telephone Encounter (Signed)
Patient advised.

## 2014-03-28 NOTE — Telephone Encounter (Signed)
Call pt: I forgot to tell her she has to quit smoking. I know she only smokes occ but is she is going to take the hormone, she has to stop completely as it can increase risk of stroke.

## 2014-03-29 LAB — COMPLETE METABOLIC PANEL WITH GFR
ALBUMIN: 4.2 g/dL (ref 3.5–5.2)
ALK PHOS: 74 U/L (ref 39–117)
ALT: 17 U/L (ref 0–35)
AST: 21 U/L (ref 0–37)
BUN: 15 mg/dL (ref 6–23)
CALCIUM: 9 mg/dL (ref 8.4–10.5)
CHLORIDE: 109 meq/L (ref 96–112)
CO2: 22 mEq/L (ref 19–32)
CREATININE: 0.84 mg/dL (ref 0.50–1.10)
GFR, EST NON AFRICAN AMERICAN: 79 mL/min
GFR, Est African American: >89 mL/min
GLUCOSE: 94 mg/dL (ref 70–99)
POTASSIUM: 4.2 meq/L (ref 3.5–5.3)
Sodium: 140 mEq/L (ref 135–145)
Total Bilirubin: 0.5 mg/dL (ref 0.2–1.2)
Total Protein: 6.5 g/dL (ref 6.0–8.3)

## 2014-03-29 LAB — LIPID PANEL
CHOLESTEROL: 226 mg/dL — AB (ref 0–200)
HDL: 71 mg/dL (ref >39)
LDL Cholesterol: 134 mg/dL — ABNORMAL HIGH (ref 0–99)
TRIGLYCERIDES: 105 mg/dL (ref <150)
Total CHOL/HDL Ratio: 3.2 Ratio
VLDL: 21 mg/dL (ref 0–40)

## 2014-03-29 LAB — VITAMIN B12: Vitamin B-12: 400 pg/mL (ref 211–911)

## 2014-03-29 LAB — CYTOLOGY - PAP

## 2014-03-29 LAB — TSH: TSH: 5.496 u[IU]/mL — ABNORMAL HIGH (ref 0.350–4.500)

## 2014-03-31 LAB — T3, FREE: T3, Free: 2.8 pg/mL (ref 2.3–4.2)

## 2014-03-31 LAB — T4, FREE: FREE T4: 0.87 ng/dL (ref 0.80–1.80)

## 2014-12-25 ENCOUNTER — Ambulatory Visit (INDEPENDENT_AMBULATORY_CARE_PROVIDER_SITE_OTHER): Payer: BLUE CROSS/BLUE SHIELD

## 2014-12-25 ENCOUNTER — Encounter: Payer: Self-pay | Admitting: Family Medicine

## 2014-12-25 ENCOUNTER — Ambulatory Visit (INDEPENDENT_AMBULATORY_CARE_PROVIDER_SITE_OTHER): Payer: BLUE CROSS/BLUE SHIELD | Admitting: Family Medicine

## 2014-12-25 VITALS — BP 178/102 | HR 76 | Wt 203.0 lb

## 2014-12-25 DIAGNOSIS — M25561 Pain in right knee: Secondary | ICD-10-CM

## 2014-12-25 DIAGNOSIS — M25562 Pain in left knee: Secondary | ICD-10-CM | POA: Diagnosis not present

## 2014-12-25 DIAGNOSIS — M1711 Unilateral primary osteoarthritis, right knee: Secondary | ICD-10-CM | POA: Insufficient documentation

## 2014-12-25 DIAGNOSIS — I10 Essential (primary) hypertension: Secondary | ICD-10-CM

## 2014-12-25 MED ORDER — HYDROCHLOROTHIAZIDE 25 MG PO TABS: 25.0000 mg | ORAL_TABLET | Freq: Every day | ORAL | Status: DC

## 2014-12-25 NOTE — Assessment & Plan Note (Signed)
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 Status post cortical steroid injection today. X-rays pending. Additionally we'll obtain uric acid. Return in a few weeks

## 2014-12-25 NOTE — Progress Notes (Signed)
Briana Castillo is a 54 y.o. female who presents to [facility]  today for knee pain. Patient notes several month history of bilateral knee pain right worse than left. This is associated swelling knees and cracking noises with knee flexion. She denies any locking or giving way. No radiating pain weakness or numbness fevers or chills. She's tried Aleve which helps. She denies any injury.  Additionally patient notes history of elevated blood pressure. She does not take any medicines currently. No chest pains or palpitations.   No past medical history on file. Past Surgical History  Procedure Laterality Date  . Tubal ligation    . Varicose vein surgery      x 2   History  Substance Use Topics  . Smoking status: Current Some Day Smoker -- 1.00 packs/day    Types: Cigarettes  . Smokeless tobacco: Not on file  . Alcohol Use: 4.8 oz/week    8 Glasses of wine per week   ROS as above Medications: Current Outpatient Prescriptions  Medication Sig Dispense Refill  . ALPRAZolam (XANAX) 0.5 MG tablet Take 1 tablet (0.5 mg total) by mouth daily as needed for anxiety. 45 tablet 1  . Estradiol-Norethindrone Acet 0.5-0.1 MG per tablet Take 1 tablet by mouth daily. 30 tablet 12  . hydrochlorothiazide (HYDRODIURIL) 25 MG tablet Take 1 tablet (25 mg total) by mouth daily. 30 tablet 2   No current facility-administered medications for this visit.   Allergies  Allergen Reactions  . Amoxicillin Shortness Of Breath and Rash    REACTION: Swelling  . Penicillins Shortness Of Breath and Rash     Exam:  BP 178/102 mmHg  Pulse 76  Wt 203 lb (92.08 kg) Gen: Well NAD HEENT: EOMI,  MMM Lungs: Normal work of breathing. CTABL Heart: RRR no MRG Abd: NABS, Soft. Nondistended, Nontender Exts: Brisk capillary refill, warm and well perfused.  Knees bilaterally without effusion. Range of motion is intact bilaterally with 1+ crepitations bilaterally. Stable ligamentous exam  bilaterally. Distal capillary refill and sensation are intact bilaterally  Limited musculoskeletal ultrasound of the right knee Quad tendon is intact normal-appearing without any significant effusion Patellar tendon is normal-appearing The medial joint line shows degenerative appearing medial meniscus with hypoechoic change within the mid substance of the meniscus The lateral joint line is normal-appearing Normal-appearing bones. Impression degenerative medial knee  Knee injection. right Consent obtained and timeout performed. Patient seated in a comfortable position with legs hanging off the table.  The medial Peri-patellar tendon space was palpated and marked. The skin was then cleaned with alcohol. Cold spray applied. A 25-gauge inch and a half needle was used to inject 80 mg of Kenalog and 4 mL of Marcaine Patient tolerated procedure well no bleeding. Pain improved following injection   No results found for this or any previous visit (from the past 24 hour(s)). No results found.   Please see individual assessment and plan sections.

## 2014-12-25 NOTE — Assessment & Plan Note (Signed)
Hypertension. Start hydrochlorothiazide check metabolic panel

## 2014-12-25 NOTE — Patient Instructions (Signed)
Thank you for coming in today. Take Tylenol daily for pain. Use up to 2 Aleve twice daily for severe pain Start hydrochlorothiazide. Return in a few weeks. Get x-rays and labs today. Call or go to the ER if you develop a large red swollen joint with extreme pain or oozing puss.   Hypertension Hypertension, commonly called high blood pressure, is when the force of blood pumping through your arteries is too strong. Your arteries are the blood vessels that carry blood from your heart throughout your body. A blood pressure reading consists of a higher number over a lower number, such as 110/72. The higher number (systolic) is the pressure inside your arteries when your heart pumps. The lower number (diastolic) is the pressure inside your arteries when your heart relaxes. Ideally you want your blood pressure below 120/80. Hypertension forces your heart to work harder to pump blood. Your arteries may become narrow or stiff. Having hypertension puts you at risk for heart disease, stroke, and other problems.  RISK FACTORS Some risk factors for high blood pressure are controllable. Others are not.  Risk factors you cannot control include:   Race. You may be at higher risk if you are African American.  Age. Risk increases with age.  Gender. Men are at higher risk than women before age 67 years. After age 90, women are at higher risk than men. Risk factors you can control include:  Not getting enough exercise or physical activity.  Being overweight.  Getting too much fat, sugar, calories, or salt in your diet.  Drinking too much alcohol. SIGNS AND SYMPTOMS Hypertension does not usually cause signs or symptoms. Extremely high blood pressure (hypertensive crisis) may cause headache, anxiety, shortness of breath, and nosebleed. DIAGNOSIS  To check if you have hypertension, your health care provider will measure your blood pressure while you are seated, with your arm held at the level of your heart.  It should be measured at least twice using the same arm. Certain conditions can cause a difference in blood pressure between your right and left arms. A blood pressure reading that is higher than normal on one occasion does not mean that you need treatment. If one blood pressure reading is high, ask your health care provider about having it checked again. TREATMENT  Treating high blood pressure includes making lifestyle changes and possibly taking medicine. Living a healthy lifestyle can help lower high blood pressure. You may need to change some of your habits. Lifestyle changes may include:  Following the DASH diet. This diet is high in fruits, vegetables, and whole grains. It is low in salt, red meat, and added sugars.  Getting at least 2 hours of brisk physical activity every week.  Losing weight if necessary.  Not smoking.  Limiting alcoholic beverages.  Learning ways to reduce stress. If lifestyle changes are not enough to get your blood pressure under control, your health care provider may prescribe medicine. You may need to take more than one. Work closely with your health care provider to understand the risks and benefits. HOME CARE INSTRUCTIONS  Have your blood pressure rechecked as directed by your health care provider.   Take medicines only as directed by your health care provider. Follow the directions carefully. Blood pressure medicines must be taken as prescribed. The medicine does not work as well when you skip doses. Skipping doses also puts you at risk for problems.   Do not smoke.   Monitor your blood pressure at home as directed by  your health care provider. SEEK MEDICAL CARE IF:   You think you are having a reaction to medicines taken.  You have recurrent headaches or feel dizzy.  You have swelling in your ankles.  You have trouble with your vision. SEEK IMMEDIATE MEDICAL CARE IF:  You develop a severe headache or confusion.  You have unusual  weakness, numbness, or feel faint.  You have severe chest or abdominal pain.  You vomit repeatedly.  You have trouble breathing. MAKE SURE YOU:   Understand these instructions.  Will watch your condition.  Will get help right away if you are not doing well or get worse. Document Released: 05/18/2005 Document Revised: 10/02/2013 Document Reviewed: 03/10/2013 [facility] Patient Information 2015 [place], [place]. This information is not intended to replace advice given to you by your health care provider. Make sure you discuss any questions you have with your health care provider.

## 2014-12-26 ENCOUNTER — Telehealth: Payer: Self-pay | Admitting: Family Medicine

## 2014-12-26 LAB — COMPLETE METABOLIC PANEL WITH GFR
ALT: 16 U/L (ref 6–29)
AST: 20 U/L (ref 10–35)
Albumin: 4.5 g/dL (ref 3.6–5.1)
Alkaline Phosphatase: 83 U/L (ref 33–130)
BUN: 18 mg/dL (ref 7–25)
CALCIUM: 9.3 mg/dL (ref 8.6–10.4)
CHLORIDE: 106 meq/L (ref 98–110)
CO2: 23 mEq/L (ref 20–31)
Creat: 0.84 mg/dL (ref 0.50–1.05)
GFR, Est African American: >89 mL/min (ref >=60)
GFR, Est Non African American: 78 mL/min (ref >=60)
Glucose, Bld: 94 mg/dL (ref 65–99)
POTASSIUM: 3.6 meq/L (ref 3.5–5.3)
SODIUM: 141 meq/L (ref 135–146)
Total Bilirubin: 0.5 mg/dL (ref 0.2–1.2)
Total Protein: 7.1 g/dL (ref 6.1–8.1)

## 2014-12-26 LAB — URIC ACID: Uric Acid, Serum: 5.3 mg/dL (ref 2.4–7.0)

## 2014-12-26 NOTE — Telephone Encounter (Signed)
She did have some concern on beginning this new BP Rx, she inquired about coming back into clinic for a repeat check before beginning Rx. Advised we could add her onto the nurse schedule for a check. Pt states she is going to the store and get a BP machine and will take her BP at home and keep a log of it. If it continues to be elevated she will get the new BP Rx and begin taking it.

## 2014-12-26 NOTE — Progress Notes (Signed)
Quick Note:  Normal, no changes. ______ 

## 2014-12-26 NOTE — Progress Notes (Signed)
Quick Note:  Xray shows arthritis like we thought. How is she feeling? ______

## 2015-01-15 ENCOUNTER — Encounter: Payer: Self-pay | Admitting: Family Medicine

## 2015-01-15 ENCOUNTER — Ambulatory Visit (INDEPENDENT_AMBULATORY_CARE_PROVIDER_SITE_OTHER): Payer: BLUE CROSS/BLUE SHIELD | Admitting: Family Medicine

## 2015-01-15 VITALS — BP 136/82 | HR 86 | Wt 199.0 lb

## 2015-01-15 DIAGNOSIS — M1711 Unilateral primary osteoarthritis, right knee: Secondary | ICD-10-CM | POA: Diagnosis not present

## 2015-01-15 DIAGNOSIS — I10 Essential (primary) hypertension: Secondary | ICD-10-CM

## 2015-01-15 NOTE — Assessment & Plan Note (Signed)
Doing well s/p injection. F/u prn. Work on weight loss.

## 2015-01-15 NOTE — Patient Instructions (Signed)
Thank you for coming in today. 'Keep track of home blood pressure.  Return in a few weeks if the blood pressure continues to be high.  Call or go to the emergency room if you get worse, have trouble breathing, have chest pains, or palpitations.   Hypertension Hypertension, commonly called high blood pressure, is when the force of blood pumping through your arteries is too strong. Your arteries are the blood vessels that carry blood from your heart throughout your body. A blood pressure reading consists of a higher number over a lower number, such as 110/72. The higher number (systolic) is the pressure inside your arteries when your heart pumps. The lower number (diastolic) is the pressure inside your arteries when your heart relaxes. Ideally you want your blood pressure below 120/80. Hypertension forces your heart to work harder to pump blood. Your arteries may become narrow or stiff. Having hypertension puts you at risk for heart disease, stroke, and other problems.  RISK FACTORS Some risk factors for high blood pressure are controllable. Others are not.  Risk factors you cannot control include:   Race. You may be at higher risk if you are African American.  Age. Risk increases with age.  Gender. Men are at higher risk than women before age 70 years. After age 48, women are at higher risk than men. Risk factors you can control include:  Not getting enough exercise or physical activity.  Being overweight.  Getting too much fat, sugar, calories, or salt in your diet.  Drinking too much alcohol. SIGNS AND SYMPTOMS Hypertension does not usually cause signs or symptoms. Extremely high blood pressure (hypertensive crisis) may cause headache, anxiety, shortness of breath, and nosebleed. DIAGNOSIS  To check if you have hypertension, your health care provider will measure your blood pressure while you are seated, with your arm held at the level of your heart. It should be measured at least twice  using the same arm. Certain conditions can cause a difference in blood pressure between your right and left arms. A blood pressure reading that is higher than normal on one occasion does not mean that you need treatment. If one blood pressure reading is high, ask your health care provider about having it checked again. TREATMENT  Treating high blood pressure includes making lifestyle changes and possibly taking medicine. Living a healthy lifestyle can help lower high blood pressure. You may need to change some of your habits. Lifestyle changes may include:  Following the DASH diet. This diet is high in fruits, vegetables, and whole grains. It is low in salt, red meat, and added sugars.  Getting at least 2 hours of brisk physical activity every week.  Losing weight if necessary.  Not smoking.  Limiting alcoholic beverages.  Learning ways to reduce stress. If lifestyle changes are not enough to get your blood pressure under control, your health care provider may prescribe medicine. You may need to take more than one. Work closely with your health care provider to understand the risks and benefits. HOME CARE INSTRUCTIONS  Have your blood pressure rechecked as directed by your health care provider.   Take medicines only as directed by your health care provider. Follow the directions carefully. Blood pressure medicines must be taken as prescribed. The medicine does not work as well when you skip doses. Skipping doses also puts you at risk for problems.   Do not smoke.   Monitor your blood pressure at home as directed by your health care provider. SEEK MEDICAL CARE  IF:   You think you are having a reaction to medicines taken.  You have recurrent headaches or feel dizzy.  You have swelling in your ankles.  You have trouble with your vision. SEEK IMMEDIATE MEDICAL CARE IF:  You develop a severe headache or confusion.  You have unusual weakness, numbness, or feel faint.  You  have severe chest or abdominal pain.  You vomit repeatedly.  You have trouble breathing. MAKE SURE YOU:   Understand these instructions.  Will watch your condition.  Will get help right away if you are not doing well or get worse. Document Released: 05/18/2005 Document Revised: 10/02/2013 Document Reviewed: 03/10/2013 [facility] Patient Information 2015 [place], [place]. This information is not intended to replace advice given to you by your health care provider. Make sure you discuss any questions you have with your health care provider.

## 2015-01-15 NOTE — Assessment & Plan Note (Signed)
Well controlled on HCTZ. No changes.

## 2015-01-15 NOTE — Progress Notes (Signed)
Briana Castillo is a 54 y.o. female who presents to [facility]  today for   1) Knee pain: Patient had right knee steroid injection a few weeks ago. She is feeling much better now. No significant pain, fever or chills.   2) HTN: Patient taking HCTZ 25 daily. She is tolerating the medicine well. No chest pain, palpitations, NVD or syncope. Her home BP readings are typically in the SBP 130s.    No past medical history on file. Past Surgical History  Procedure Laterality Date  . Tubal ligation    . Varicose vein surgery      x 2   Social History  Substance Use Topics  . Smoking status: Current Some Day Smoker -- 1.00 packs/day    Types: Cigarettes  . Smokeless tobacco: Not on file  . Alcohol Use: 4.8 oz/week    8 Glasses of wine per week   ROS as above Medications: Current Outpatient Prescriptions  Medication Sig Dispense Refill  . ALPRAZolam (XANAX) 0.5 MG tablet Take 1 tablet (0.5 mg total) by mouth daily as needed for anxiety. 45 tablet 1  . Estradiol-Norethindrone Acet 0.5-0.1 MG per tablet Take 1 tablet by mouth daily. 30 tablet 12  . hydrochlorothiazide (HYDRODIURIL) 25 MG tablet Take 1 tablet (25 mg total) by mouth daily. 30 tablet 2   No current facility-administered medications for this visit.   Allergies  Allergen Reactions  . Amoxicillin Shortness Of Breath and Rash    REACTION: Swelling  . Penicillins Shortness Of Breath and Rash     Exam:  BP 136/82 mmHg  Pulse 86  Wt 199 lb (90.266 kg)  LMP 09/13/2011   Gen: Well NAD HEENT: EOMI,  MMM Lungs: Normal work of breathing. CTABL Heart: RRR no MRG Abd: NABS, Soft. Nondistended, Nontender Exts: Brisk capillary refill, warm and well perfused.   No results found for this or any previous visit (from the past 24 hour(s)). No results found.   Please see individual assessment and plan sections.

## 2015-03-05 ENCOUNTER — Ambulatory Visit (INDEPENDENT_AMBULATORY_CARE_PROVIDER_SITE_OTHER): Payer: BLUE CROSS/BLUE SHIELD | Admitting: Family Medicine

## 2015-03-05 ENCOUNTER — Encounter: Payer: Self-pay | Admitting: Family Medicine

## 2015-03-05 VITALS — BP 132/86 | HR 62 | Wt 205.0 lb

## 2015-03-05 DIAGNOSIS — R609 Edema, unspecified: Secondary | ICD-10-CM | POA: Diagnosis not present

## 2015-03-05 DIAGNOSIS — M7062 Trochanteric bursitis, left hip: Secondary | ICD-10-CM | POA: Diagnosis not present

## 2015-03-05 DIAGNOSIS — M25562 Pain in left knee: Secondary | ICD-10-CM | POA: Insufficient documentation

## 2015-03-05 DIAGNOSIS — R600 Localized edema: Secondary | ICD-10-CM

## 2015-03-05 DIAGNOSIS — M7060 Trochanteric bursitis, unspecified hip: Secondary | ICD-10-CM | POA: Insufficient documentation

## 2015-03-05 MED ORDER — FUROSEMIDE 20 MG PO TABS: 20.0000 mg | ORAL_TABLET | Freq: Every day | ORAL | Status: DC | PRN

## 2015-03-05 NOTE — Patient Instructions (Signed)
Thank you for coming in today. Call or go to the ER if you develop a large red swollen joint with extreme pain or oozing puss.  Return in 1 month for recheck.  Use lasix daily as needed for leg swelling.  Use compression stockings.

## 2015-03-05 NOTE — Assessment & Plan Note (Signed)
Treat with injection today. Continue home exercises. Return in one month

## 2015-03-05 NOTE — Assessment & Plan Note (Signed)
Likely related to venous insufficiency based on her history of venous insufficiency problems. Doubtful for heart or kidney disease. Plan to check metabolic panel and TSH. Use Lasix sparingly as needed. Recommend compression stockings. Recheck in one month

## 2015-03-05 NOTE — Progress Notes (Signed)
Briana Castillo is a 54 y.o. female who presents to [facility]  today for left knee and left hip pain. Patient was seen in July for right knee pain. She received a steroid injection and felt much better. In the interim she's noted continued left knee pain. This is moderate and worse with activity. Additionally she has left lateral hip pain. This is also moderate and worse when she lays on that side and when she rises from a seated position. She's tried some over-the-counter medicines for pain which helps a bit.  Additionally patient has a history of hypertension. She currently is taking hydrochlorothiazide and doing well with no chest pains or palpitations.  Additionally patient has intermittent lower extremity edema. This been ongoing for years. Hydrochlorothiazide has helped but not fully alleviated her symptoms. She has days where she has worsening bilateral leg swelling.  She denies any chest pain or orthopnea shortness of breath palpitations.   No past medical history on file. Past Surgical History  Procedure Laterality Date  . Tubal ligation    . Varicose vein surgery      x 2   Social History  Substance Use Topics  . Smoking status: Current Some Day Smoker -- 1.00 packs/day    Types: Cigarettes  . Smokeless tobacco: Not on file  . Alcohol Use: 4.8 oz/week    8 Glasses of wine per week   family history includes Diabetes in her maternal grandmother; Heart failure (age of onset: 66) in her father.  ROS as above Medications: Current Outpatient Prescriptions  Medication Sig Dispense Refill  . ALPRAZolam (XANAX) 0.5 MG tablet Take 1 tablet (0.5 mg total) by mouth daily as needed for anxiety. 45 tablet 1  . furosemide (LASIX) 20 MG tablet Take 1 tablet (20 mg total) by mouth daily as needed for edema. 30 tablet 0  . hydrochlorothiazide (HYDRODIURIL) 25 MG tablet Take 1 tablet (25 mg total) by mouth daily. 30 tablet 2   No current facility-administered  medications for this visit.   Allergies  Allergen Reactions  . Amoxicillin Shortness Of Breath and Rash    REACTION: Swelling  . Penicillins Shortness Of Breath and Rash     Exam:  BP 132/86 mmHg  Pulse 62  Wt 205 lb (92.987 kg)  LMP 09/13/2011 Gen: Well NAD HEENT: EOMI,  MMM Lungs: Normal work of breathing. CTABL Heart: RRR no MRG Abd: NABS, Soft. Nondistended, Nontender Exts: Brisk capillary refill, warm and well perfused.  Left knee minimal effusion otherwise normal appearing. Range of motion 0-120 with 1+ retropatellar crepitations. Nontender. Stable ligamentous exam. Left hip normal-appearing normal range of motion. Tender palpation left greater trochanter. Hip abduction strength 5/5. Normal gait.  Greater trochanteric bursa injection: Left Consent obtained and timeout performed.  Patient laying on side with affected hip up.   Area located and marked.   Skin cleaned with rubbing alcohol and chlorhexidine, and cold spray applied Using a spinal needle the greater trochanteric bursa was accessed.   40 mg of Kenalog and 4 mL of Marcaine were injected in a wheel pattern.   Immediate improvement following injection: YES Patient tolerated procedure well with no weakness or numbness or bleeding.    Procedure: Real-time Ultrasound Guided Injection of left knee  Device: GE Logiq E  Images permanently stored and available for review in the ultrasound unit. Verbal informed consent obtained. Discussed risks and benefits of procedure. Warned about infection bleeding damage to structures skin hypopigmentation and fat atrophy among  others. Patient expresses understanding and agreement Time-out conducted.  Noted no overlying erythema, induration, or other signs of local infection.  Skin prepped in a sterile fashion.  Local anesthesia: Topical Ethyl chloride.  With sterile technique and under real time ultrasound guidance: 80 mg of Depo-Medrol, and 4 mL of Marcaine injected  easily.  Completed without difficulty  Pain immediately resolved suggesting accurate placement of the medication.  Advised to call if fevers/chills, erythema, induration, drainage, or persistent bleeding.  Images permanently stored and available for review in the ultrasound unit.  Impression: Technically successful ultrasound guided injection.     No results found for this or any previous visit (from the past 24 hour(s)). No results found.   Please see individual assessment and plan sections.

## 2015-03-05 NOTE — Assessment & Plan Note (Signed)
Injection today. Reassess in one month. Likely due to DJD

## 2015-03-06 ENCOUNTER — Ambulatory Visit: Payer: BLUE CROSS/BLUE SHIELD | Admitting: Family Medicine

## 2015-03-06 LAB — COMPLETE METABOLIC PANEL WITH GFR
ALBUMIN: 4 g/dL (ref 3.6–5.1)
ALK PHOS: 76 U/L (ref 33–130)
ALT: 20 U/L (ref 6–29)
AST: 22 U/L (ref 10–35)
BUN: 17 mg/dL (ref 7–25)
CHLORIDE: 107 mmol/L (ref 98–110)
CO2: 27 mmol/L (ref 20–31)
Calcium: 9.2 mg/dL (ref 8.6–10.4)
Creat: 0.95 mg/dL (ref 0.50–1.05)
GFR, EST NON AFRICAN AMERICAN: 68 mL/min (ref >=60)
GFR, Est African American: 78 mL/min (ref >=60)
Glucose, Bld: 80 mg/dL (ref 65–99)
POTASSIUM: 3.6 mmol/L (ref 3.5–5.3)
Sodium: 143 mmol/L (ref 135–146)
Total Bilirubin: 0.5 mg/dL (ref 0.2–1.2)
Total Protein: 6.5 g/dL (ref 6.1–8.1)

## 2015-03-06 LAB — TSH: TSH: 5.403 u[IU]/mL — AB (ref 0.350–4.500)

## 2015-03-11 NOTE — Progress Notes (Signed)
Quick Note:  TSH is a little high. This needs further testing. Return in 1 month. ______

## 2015-04-02 ENCOUNTER — Other Ambulatory Visit: Payer: Self-pay | Admitting: Family Medicine

## 2015-04-05 ENCOUNTER — Other Ambulatory Visit: Payer: Self-pay | Admitting: Family Medicine

## 2015-04-05 DIAGNOSIS — Z1231 Encounter for screening mammogram for malignant neoplasm of breast: Secondary | ICD-10-CM

## 2015-04-08 ENCOUNTER — Ambulatory Visit: Payer: BLUE CROSS/BLUE SHIELD | Admitting: Family Medicine

## 2015-04-18 ENCOUNTER — Ambulatory Visit (INDEPENDENT_AMBULATORY_CARE_PROVIDER_SITE_OTHER): Payer: BLUE CROSS/BLUE SHIELD | Admitting: Family Medicine

## 2015-04-18 ENCOUNTER — Encounter: Payer: Self-pay | Admitting: Family Medicine

## 2015-04-18 ENCOUNTER — Ambulatory Visit (INDEPENDENT_AMBULATORY_CARE_PROVIDER_SITE_OTHER): Payer: BLUE CROSS/BLUE SHIELD

## 2015-04-18 VITALS — BP 126/78 | HR 68 | Wt 202.0 lb

## 2015-04-18 VITALS — BP 126/78 | HR 68 | Ht 67.0 in | Wt 202.9 lb

## 2015-04-18 DIAGNOSIS — Z6831 Body mass index (BMI) 31.0-31.9, adult: Secondary | ICD-10-CM | POA: Diagnosis not present

## 2015-04-18 DIAGNOSIS — R635 Abnormal weight gain: Secondary | ICD-10-CM | POA: Diagnosis not present

## 2015-04-18 DIAGNOSIS — Z1159 Encounter for screening for other viral diseases: Secondary | ICD-10-CM

## 2015-04-18 DIAGNOSIS — Z0000 Encounter for general adult medical examination without abnormal findings: Secondary | ICD-10-CM | POA: Diagnosis not present

## 2015-04-18 DIAGNOSIS — I10 Essential (primary) hypertension: Secondary | ICD-10-CM

## 2015-04-18 DIAGNOSIS — Z114 Encounter for screening for human immunodeficiency virus [HIV]: Secondary | ICD-10-CM

## 2015-04-18 DIAGNOSIS — R7989 Other specified abnormal findings of blood chemistry: Secondary | ICD-10-CM

## 2015-04-18 DIAGNOSIS — R928 Other abnormal and inconclusive findings on diagnostic imaging of breast: Secondary | ICD-10-CM | POA: Diagnosis not present

## 2015-04-18 DIAGNOSIS — Z23 Encounter for immunization: Secondary | ICD-10-CM

## 2015-04-18 DIAGNOSIS — Z1231 Encounter for screening mammogram for malignant neoplasm of breast: Secondary | ICD-10-CM

## 2015-04-18 DIAGNOSIS — R609 Edema, unspecified: Secondary | ICD-10-CM | POA: Diagnosis not present

## 2015-04-18 DIAGNOSIS — M1711 Unilateral primary osteoarthritis, right knee: Secondary | ICD-10-CM | POA: Diagnosis not present

## 2015-04-18 MED ORDER — FUROSEMIDE 20 MG PO TABS: 20.0000 mg | ORAL_TABLET | Freq: Every day | ORAL | Status: DC | PRN

## 2015-04-18 MED ORDER — ALPRAZOLAM 0.5 MG PO TABS: 0.5000 mg | ORAL_TABLET | Freq: Every day | ORAL | Status: DC | PRN

## 2015-04-18 MED ORDER — PHENTERMINE HCL 37.5 MG PO TABS: 37.5000 mg | ORAL_TABLET | Freq: Every day | ORAL | Status: DC

## 2015-04-18 NOTE — Patient Instructions (Signed)
Thank you for coming in today. Call or go to the ER if you develop a large red swollen joint with extreme pain or oozing puss.   Return as needed for injection.   Do some reading on visco-supplementation such as Supartz, or Synvisc.

## 2015-04-18 NOTE — Progress Notes (Signed)
Briana Castillo is a 54 y.o. female who [facility]  today for right knee pain. Patient has bilateral knee pain due to DJD. She last received a right knee injection in July. This worked well until recently. She's been doing lunges and squats as part of an exercise program and notes worsening knee pain. She denies any locking or catching. No fevers chills nausea vomiting or diarrhea. She would like a repeat steroid injection to her right knee of possible.   No past medical history on file. Past Surgical History  Procedure Laterality Date  . Tubal ligation    . Varicose vein surgery      x 2   Social History  Substance Use Topics  . Smoking status: Current Some Day Smoker -- 1.00 packs/day    Types: Cigarettes  . Smokeless tobacco: Not on file  . Alcohol Use: 4.8 oz/week    8 Glasses of wine per week   family history includes Diabetes in her maternal grandmother; Heart failure (age of onset: 6174) in her father.  ROS as above Medications: Current Outpatient Prescriptions  Medication Sig Dispense Refill  . ALPRAZolam (XANAX) 0.5 MG tablet Take 1 tablet (0.5 mg total) by mouth daily as needed for anxiety. 45 tablet 1  . furosemide (LASIX) 20 MG tablet Take 1 tablet (20 mg total) by mouth daily as needed for edema. 30 tablet 1  . hydrochlorothiazide (HYDRODIURIL) 25 MG tablet TAKE ONE TABLET BY MOUTH ONCE DAILY 30 tablet 0  . phentermine (ADIPEX-P) 37.5 MG tablet Take 1 tablet (37.5 mg total) by mouth daily before breakfast. 30 tablet 0   No current facility-administered medications for this visit.   Allergies  Allergen Reactions  . Amoxicillin Shortness Of Breath and Rash    REACTION: Swelling  . Penicillins Shortness Of Breath and Rash     Exam:  BP 126/78 mmHg  Pulse 68  Wt 202 lb (91.627 kg)  LMP 09/13/2011 Gen: Well NAD Right knee is normal-appearing without any significant effusion. Range of motion 0-120 with 1+ Rich patellar  crepitations. Nontender. Stable ligamentous exam. Normal gait  Procedure: Real-time Ultrasound Guided Injection of Right Knee  Device: GE Logiq E  Images permanently stored and available for review in the ultrasound unit. Verbal informed consent obtained. Discussed risks and benefits of procedure. Warned about infection bleeding damage to structures skin hypopigmentation and fat atrophy among others. Patient expresses understanding and agreement Time-out conducted.  Noted no overlying erythema, induration, or other signs of local infection.  Skin prepped in a sterile fashion.  Local anesthesia: Topical Ethyl chloride.  With sterile technique and under real time ultrasound guidance: 80mg  Kenalog, and 4 mL of Marcaine injected easily.  Completed without difficulty  Pain immediately resolved suggesting accurate placement of the medication.  Advised to call if fevers/chills, erythema, induration, drainage, or persistent bleeding.  Images permanently stored and available for review in the ultrasound unit.  Impression: Technically successful ultrasound guided injection.   No results found for this or any previous visit (from the past 24 hour(s)). No results found.   Please see individual assessment and plan sections.

## 2015-04-18 NOTE — Addendum Note (Signed)
Addended by: Baird KayUGLAS, Kemiah Booz M on: 04/18/2015 04:18 PM   Modules accepted: Orders

## 2015-04-18 NOTE — Progress Notes (Addendum)
Subjective:     Briana Castillo is a 54 y.o. female and is here for a comprehensive physical exam. The patient reports no problems.  Social History   Social History  . Marital Status: Divorced    Spouse Name: N/A  . Number of Children: 1  . 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    Social History Main Topics  . Smoking status: Current Some Day Smoker -- 1.00 packs/day    Types: Cigarettes  . Smokeless tobacco: Not on file  . Alcohol Use: 4.8 oz/week    8 Glasses of wine per week  . Drug Use: No  . Sexual Activity: Not on file     Comment: divorced, regular exercise.   Other Topics Concern  . Not on file   Social History Narrative   She drinks 2-3 caffeinated beverages daily. Regular exercise.   Health Maintenance  Topic Date Due  . Hepatitis C Screening  [birth year]  . HIV Screening  10/[birth year]/1974  . INFLUENZA VACCINE  12/31/2014  . MAMMOGRAM  03/28/2016  . PAP SMEAR  03/28/2016  . TETANUS/TDAP  11/12/2020  . COLONOSCOPY  01/30/2021    The following portions of the patient's history were reviewed and updated as appropriate: allergies, current medications, past family history, past medical history, past social history, past surgical history and problem list.  Review of Systems Pertinent items noted in HPI and remainder of comprehensive ROS otherwise negative.   Objective:    BP 126/78 mmHg  Pulse 68  Ht 5\' 7"  (1.702 m)  Wt 202 lb 14.4 oz (92.035 kg)  BMI 31.77 kg/m2  SpO2 99%  LMP 09/13/2011 General appearance: alert, cooperative and appears stated age Head: Normocephalic, without obvious abnormality, atraumatic Eyes:   Ears: conj clear, EOMI, PEERLA Nose: Nares normal. Septum midline. Mucosa normal. No drainage or sinus tenderness. Throat: lips, mucosa, and tongue normal; teeth and gums normal Neck: no adenopathy, no carotid bruit, no JVD, supple, symmetrical, trachea midline and thyroid not enlarged,  symmetric, no tenderness/mass/nodules Back: symmetric, no curvature. ROM normal. No CVA tenderness. Lungs: clear to auscultation bilaterally Breasts: normal appearance, no masses or tenderness Heart: RRR with harsh vibratory S1, loudest at the right sternal border.  Abdomen: soft, non-tender; bowel sounds normal; no masses,  no organomegaly Extremities: extremities normal, atraumatic, no cyanosis or edema Pulses: 2+ and symmetric Skin: Skin color, texture, turgor normal. No rashes or lesions Lymph nodes: Cervical, supraclavicular, and axillary nodes normal. Neurologic: Alert and oriented X 3, normal strength and tone. Normal symmetric reflexes. Normal coordination and gait    Assessment:    Healthy female exam.      Plan:     See After Visit Summary for Counseling Recommendations  Keep up a regular exercise program and make sure you are eating a healthy diet Try to eat 4 servings of dairy a day, or if you are lactose intolerant take a calcium with vitamin D daily.  Your vaccines are up to date.  Due for Hep C and HIV screening.    Declined flu vaccine.   Mammogram up-to-date. Done today.  Given shingles vaccine IM   HTN - well controlled.  Doing well on hydrochlorothiazide daily. And she just uses the furosemide as needed. She's only used a few times since since it was prescribed.  Abnormal weight gain/BMI 31-she's really trying to work through a trainer at work to lose weight. She took phentermine about 2 years  ago with [place] and did well on it. She would like to consider restarting it. She did have to split the tabs when she first started until she got used to it. Her blood pressure is now back under good control. Prescription given today. She will follow-up in one month for blood pressure and weight check.

## 2015-04-19 LAB — LIPID PANEL
CHOL/HDL RATIO: 3.5 ratio (ref <=5.0)
CHOLESTEROL: 259 mg/dL — AB (ref 125–200)
HDL: 73 mg/dL (ref >=46)
LDL Cholesterol: 162 mg/dL — ABNORMAL HIGH (ref <130)
Triglycerides: 122 mg/dL (ref <150)
VLDL: 24 mg/dL (ref <30)

## 2015-04-19 LAB — T4, FREE: FREE T4: 0.91 ng/dL (ref 0.80–1.80)

## 2015-04-19 LAB — TSH: TSH: 3.025 u[IU]/mL (ref 0.350–4.500)

## 2015-04-19 LAB — T3, FREE: T3 FREE: 2.3 pg/mL (ref 2.3–4.2)

## 2015-04-19 NOTE — Assessment & Plan Note (Signed)
Continue weight loss. Avoid lunges and squats. Steroid injection today. Discussed visco-supplementation. Return as needed

## 2015-04-20 LAB — HEPATITIS C ANTIBODY: HCV AB: NEGATIVE

## 2015-04-20 LAB — HIV ANTIBODY (ROUTINE TESTING W REFLEX): HIV 1&2 Ab, 4th Generation: NONREACTIVE

## 2015-04-22 ENCOUNTER — Encounter: Payer: Self-pay | Admitting: Family Medicine

## 2015-04-22 DIAGNOSIS — E785 Hyperlipidemia, unspecified: Secondary | ICD-10-CM | POA: Insufficient documentation

## 2015-04-23 ENCOUNTER — Other Ambulatory Visit: Payer: Self-pay | Admitting: Family Medicine

## 2015-04-23 DIAGNOSIS — R928 Other abnormal and inconclusive findings on diagnostic imaging of breast: Secondary | ICD-10-CM

## 2015-04-30 ENCOUNTER — Ambulatory Visit
Admission: RE | Admit: 2015-04-30 | Discharge: 2015-04-30 | Disposition: A | Discharge status: Home / Self Care | Payer: BLUE CROSS/BLUE SHIELD | Source: Ambulatory Visit | Attending: Family Medicine | Admitting: Family Medicine

## 2015-04-30 DIAGNOSIS — R928 Other abnormal and inconclusive findings on diagnostic imaging of breast: Secondary | ICD-10-CM

## 2015-05-10 ENCOUNTER — Other Ambulatory Visit: Payer: Self-pay | Admitting: Family Medicine

## 2015-05-20 ENCOUNTER — Ambulatory Visit: Payer: BLUE CROSS/BLUE SHIELD | Admitting: Family Medicine

## 2015-06-20 ENCOUNTER — Other Ambulatory Visit: Payer: Self-pay | Admitting: Family Medicine

## 2015-10-24 ENCOUNTER — Other Ambulatory Visit: Payer: Self-pay | Admitting: Family Medicine

## 2015-11-27 ENCOUNTER — Other Ambulatory Visit: Payer: Self-pay | Admitting: Family Medicine

## 2015-12-19 ENCOUNTER — Other Ambulatory Visit: Payer: Self-pay | Admitting: Family Medicine

## 2015-12-20 ENCOUNTER — Other Ambulatory Visit: Payer: Self-pay | Admitting: *Deleted

## 2015-12-20 NOTE — Telephone Encounter (Signed)
Error

## 2015-12-23 ENCOUNTER — Other Ambulatory Visit: Payer: Self-pay | Admitting: Family Medicine

## 2015-12-25 ENCOUNTER — Telehealth: Payer: Self-pay | Admitting: Family Medicine

## 2015-12-25 NOTE — Telephone Encounter (Signed)
Left pt a message for patient to call and reschedule a f/u on her BP

## 2016-01-23 ENCOUNTER — Encounter: Payer: Self-pay | Admitting: Family Medicine

## 2016-01-23 ENCOUNTER — Ambulatory Visit (INDEPENDENT_AMBULATORY_CARE_PROVIDER_SITE_OTHER): Payer: BLUE CROSS/BLUE SHIELD | Admitting: Family Medicine

## 2016-01-23 VITALS — BP 118/72 | HR 63 | Ht 67.0 in | Wt 195.0 lb

## 2016-01-23 DIAGNOSIS — Z720 Tobacco use: Secondary | ICD-10-CM

## 2016-01-23 DIAGNOSIS — I10 Essential (primary) hypertension: Secondary | ICD-10-CM

## 2016-01-23 DIAGNOSIS — F419 Anxiety disorder, unspecified: Secondary | ICD-10-CM

## 2016-01-23 DIAGNOSIS — M2550 Pain in unspecified joint: Secondary | ICD-10-CM | POA: Diagnosis not present

## 2016-01-23 MED ORDER — ALPRAZOLAM 0.5 MG PO TABS: 0.5000 mg | ORAL_TABLET | Freq: Every day | ORAL | 1 refills | Status: DC | PRN

## 2016-01-23 MED ORDER — VARENICLINE TARTRATE 0.5 MG X 11 & 1 MG X 42 PO MISC: ORAL | 0 refills | Status: DC

## 2016-01-23 MED ORDER — HYDROCHLOROTHIAZIDE 25 MG PO TABS: 25.0000 mg | ORAL_TABLET | Freq: Every day | ORAL | 6 refills | Status: DC

## 2016-01-23 NOTE — Progress Notes (Signed)
Subjective:    CC: HTN, Anxiety, last seen in Nov 2016  HPI: Hypertension- Pt denies chest pain, SOB, dizziness, or heart palpitations.  Taking meds as directed w/o problems.  Denies medication side effects.  Occ uses her lasix prn for swelling.    Anxiety - rarely uses her xanax. She did like a refill today.  Tob abuse - She is down to half a pack a day hasn't interested in starting Chantix but she did have some questions about it today.  Joint pain - she has started taking Tumeric and feelsl ike it has really helped her.  She thinks it may have been causing some loose stools when she was taking it with her magnesium.  Past medical history, Surgical history, Family history not pertinant except as noted below, Social history, Allergies, and medications have been entered into the medical record, reviewed, and corrections made.   Review of Systems: No fevers, chills, night sweats, weight loss, chest pain, or shortness of breath.   Objective:    Vitals:   01/23/16 0838  Weight: 195 lb (88.5 kg)  Height: 5\' 7"  (1.702 m)    General: Well Developed, well nourished, and in no acute distress.  Neuro: Alert and oriented x3, extra-ocular muscles intact, sensation grossly intact.  HEENT: Normocephalic, atraumatic  Skin: Warm and dry, no rashes. Cardiac: Regular rate and rhythm, 2/6 SEM, no rubs or gallops, no lower extremity edema.  Respiratory: Clear to auscultation bilaterally. Not using accessory muscles, speaking in full sentences.   Impression and Recommendations:    HTN - Well controlled. Continue current regimen. Follow up in  6 months.   Anxiety - Stable. Continue to use Xanax infrequently. Refilled medication today. Next  Tobacco abuse-discussed Chantix side effects and pros and cons. Do not mix with nicotine products such as patches etc. Follow-up if any problems. New perception sent for starter pack. Encouraged her to maintain for a full 3 months if she's tolerating it well  without any side effects.   Joint pain-improved on to moderate. She's also lost about 6 pounds which is fantastic as well.

## 2016-02-06 ENCOUNTER — Ambulatory Visit: Payer: BLUE CROSS/BLUE SHIELD | Admitting: Family Medicine

## 2016-02-19 DIAGNOSIS — H52223 Regular astigmatism, bilateral: Secondary | ICD-10-CM | POA: Diagnosis not present

## 2016-03-02 ENCOUNTER — Telehealth: Payer: Self-pay

## 2016-03-02 MED ORDER — VARENICLINE TARTRATE 1 MG PO TABS: 1.0000 mg | ORAL_TABLET | Freq: Two times a day (BID) | ORAL | 1 refills | Status: DC

## 2016-03-02 NOTE — Telephone Encounter (Signed)
chantix Refill sent to pharmacy

## 2016-03-26 DIAGNOSIS — H52223 Regular astigmatism, bilateral: Secondary | ICD-10-CM | POA: Diagnosis not present

## 2016-05-04 ENCOUNTER — Telehealth: Payer: Self-pay

## 2016-05-04 ENCOUNTER — Other Ambulatory Visit: Payer: Self-pay

## 2016-05-04 MED ORDER — VARENICLINE TARTRATE 1 MG PO TABS: 1.0000 mg | ORAL_TABLET | Freq: Two times a day (BID) | ORAL | 1 refills | Status: DC

## 2016-05-04 NOTE — Telephone Encounter (Signed)
Yes, Ok to refill with continueing pack.

## 2016-06-12 ENCOUNTER — Other Ambulatory Visit: Payer: Self-pay | Admitting: Family Medicine

## 2016-06-12 DIAGNOSIS — Z1231 Encounter for screening mammogram for malignant neoplasm of breast: Secondary | ICD-10-CM

## 2016-07-01 ENCOUNTER — Encounter: Payer: Self-pay | Admitting: Family Medicine

## 2016-07-01 ENCOUNTER — Telehealth: Payer: Self-pay

## 2016-07-01 ENCOUNTER — Ambulatory Visit (INDEPENDENT_AMBULATORY_CARE_PROVIDER_SITE_OTHER): Payer: BLUE CROSS/BLUE SHIELD | Admitting: Family Medicine

## 2016-07-01 ENCOUNTER — Ambulatory Visit (INDEPENDENT_AMBULATORY_CARE_PROVIDER_SITE_OTHER): Payer: BLUE CROSS/BLUE SHIELD

## 2016-07-01 ENCOUNTER — Other Ambulatory Visit (HOSPITAL_COMMUNITY)
Admission: RE | Admit: 2016-07-01 | Discharge: 2016-07-01 | Disposition: A | Discharge status: Home / Self Care | Payer: BLUE CROSS/BLUE SHIELD | Source: Ambulatory Visit | Attending: Family Medicine | Admitting: Family Medicine

## 2016-07-01 VITALS — BP 110/72 | HR 63 | Ht 67.0 in | Wt 201.0 lb

## 2016-07-01 DIAGNOSIS — Z79890 Hormone replacement therapy (postmenopausal): Secondary | ICD-10-CM

## 2016-07-01 DIAGNOSIS — Z87891 Personal history of nicotine dependence: Secondary | ICD-10-CM | POA: Diagnosis not present

## 2016-07-01 DIAGNOSIS — Z0000 Encounter for general adult medical examination without abnormal findings: Secondary | ICD-10-CM | POA: Diagnosis not present

## 2016-07-01 DIAGNOSIS — R635 Abnormal weight gain: Secondary | ICD-10-CM

## 2016-07-01 DIAGNOSIS — Z1231 Encounter for screening mammogram for malignant neoplasm of breast: Secondary | ICD-10-CM | POA: Diagnosis not present

## 2016-07-01 DIAGNOSIS — Z01419 Encounter for gynecological examination (general) (routine) without abnormal findings: Secondary | ICD-10-CM | POA: Diagnosis not present

## 2016-07-01 LAB — COMPLETE METABOLIC PANEL WITH GFR
ALBUMIN: 4 g/dL (ref 3.6–5.1)
ALT: 17 U/L (ref 6–29)
AST: 21 U/L (ref 10–35)
Alkaline Phosphatase: 79 U/L (ref 33–130)
BUN: 14 mg/dL (ref 7–25)
CALCIUM: 9.4 mg/dL (ref 8.6–10.4)
CHLORIDE: 103 mmol/L (ref 98–110)
CO2: 27 mmol/L (ref 20–31)
CREATININE: 0.85 mg/dL (ref 0.50–1.05)
GFR, Est African American: 89 mL/min (ref >=60)
GFR, Est Non African American: 77 mL/min (ref >=60)
Glucose, Bld: 96 mg/dL (ref 65–99)
Potassium: 3.7 mmol/L (ref 3.5–5.3)
Sodium: 140 mmol/L (ref 135–146)
Total Bilirubin: 0.5 mg/dL (ref 0.2–1.2)
Total Protein: 7 g/dL (ref 6.1–8.1)

## 2016-07-01 LAB — TSH: TSH: 3.7 m[IU]/L

## 2016-07-01 MED ORDER — ESTRADIOL-NORETHINDRONE ACET 0.5-0.1 MG PO TABS: 1.0000 | ORAL_TABLET | Freq: Every day | ORAL | 12 refills | Status: DC

## 2016-07-01 MED ORDER — PHENTERMINE HCL 15 MG PO CAPS: 15.0000 mg | ORAL_CAPSULE | ORAL | 0 refills | Status: DC

## 2016-07-01 MED ORDER — VARENICLINE TARTRATE 1 MG PO TABS: 1.0000 mg | ORAL_TABLET | Freq: Two times a day (BID) | ORAL | 1 refills | Status: DC

## 2016-07-01 MED ORDER — PHENTERMINE HCL 15 MG PO TBDP: 15.0000 mg | ORAL_TABLET | ORAL | 0 refills | Status: DC

## 2016-07-01 MED ORDER — HYDROCHLOROTHIAZIDE 25 MG PO TABS: 25.0000 mg | ORAL_TABLET | Freq: Every day | ORAL | 6 refills | Status: DC

## 2016-07-01 NOTE — Progress Notes (Signed)
Subjective:     Briana Castillo is a 56 y.o. female and is here for a comprehensive physical exam. The patient reports problems - she is interested in getting back on estrogen. she has quit smoking now.  she is quit for almost 4 months now. She is still taking the Chantix though. We've given her an additional refill. She is requesting to have 1 more refill. She says it really makes a big difference in helping to curve her cravings etc. Now that she's not been smoking for 4 months she would like to consider going back on the him on replacement therapy for a short period of time. She said she found her old prescription from a couple years ago and actually has been taking it for the last month. She said she actually never took it the first time. She is a heads made a huge difference in her hot flashes. She says normally at work she would turn her fan on multiple times a day and she's noticed that she has been turned on an almost a month since starting the medication.  Social History   Social History  . Marital status: Divorced    Spouse name: N/A  . Number of children: 1  . Years of education: N/A   Occupational History  . 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    Social History Main Topics  . Smoking status: Current Some Day Smoker    Packs/day: 1.00    Types: Cigarettes  . Smokeless tobacco: Never Used  . Alcohol use 4.8 oz/week    8 Glasses of wine per week  . Drug use: No  . Sexual activity: Not on file     Comment: divorced, regular exercise.   Other Topics Concern  . Not on file   Social History Narrative   She drinks 2-3 caffeinated beverages daily. Regular exercise.   Health Maintenance  Topic Date Due  . PAP SMEAR  03/28/2016  . INFLUENZA VACCINE  08/30/2018 (Originally 12/31/2015)  . MAMMOGRAM  04/17/2017  . TETANUS/TDAP  11/12/2020  . COLONOSCOPY  01/30/2021  . Hepatitis C Screening  Completed  . HIV Screening  Completed    The following portions of the patient's  history were reviewed and updated as appropriate: allergies, current medications, past family history, past medical history, past social history, past surgical history and problem list.  Review of Systems A comprehensive review of systems was negative.   Objective:    BP 110/72   Pulse 63   Ht 5\' 7"  (1.702 m)   Wt 201 lb (91.2 kg)   LMP 09/13/2011   SpO2 100%   BMI 31.48 kg/m  General appearance: alert, cooperative and appears stated age Head: Normocephalic, without obvious abnormality, atraumatic Eyes: conj clear, EOMI, PEERLA Ears: normal TM's and external ear canals both ears Nose: Nares normal. Septum midline. Mucosa normal. No drainage or sinus tenderness. Throat: lips, mucosa, and tongue normal; teeth and gums normal Neck: no adenopathy, no carotid bruit, no JVD, supple, symmetrical, trachea midline and thyroid not enlarged, symmetric, no tenderness/mass/nodules Back: symmetric, no curvature. ROM normal. No CVA tenderness. Lungs: coarse BS bilaterally Breasts: normal appearance, no masses or tenderness Heart: regular rate and rhythm, S1, S2 normal, no murmur, click, rub or gallop Abdomen: soft, non-tender; bowel sounds normal; no masses,  no organomegaly Pelvic: cervix normal in appearance, external genitalia normal, no adnexal masses or tenderness, no cervical motion tenderness, rectovaginal septum normal, uterus normal size, shape, and consistency and vagina  normal without discharge Extremities: extremities normal, atraumatic, no cyanosis or edema Pulses: 2+ and symmetric Skin: Skin color, texture, turgor normal. No rashes or lesions Lymph nodes: Cervical, supraclavicular, and axillary nodes normal. Neurologic: Alert and oriented X 3, normal strength and tone. Normal symmetric reflexes. Normal coordination and gait    Assessment:    Healthy female exam.     Plan:     See After Visit Summary for Counseling Recommendations   Keep up a regular exercise program and make  sure you are eating a healthy diet Try to eat 4 servings of dairy a day, or if you are lactose intolerant take a calcium with vitamin D daily.  Your vaccines are up to date.    Tobacco abuse-successfully quit for most 4 months. Will refill her Chantix again today.  Hormone replacement therapy-reminded her of increased risks for cardiovascular disease, breast cancer, blood clots etc. She understands this and understands that is also only used for short period of time such as a couple of years. We'll go ahead and fill the medication.  Abnormal weight gain/obesity-she would like to consider restarting phentermine. She took it previously with good success. Will restart at 15 mg daily. Month for blood pressure and weight check with nurse visit.

## 2016-07-01 NOTE — Patient Instructions (Signed)
Keep up a regular exercise program and make sure you are eating a healthy diet Try to eat 4 servings of dairy a day, or if you are lactose intolerant take a calcium with vitamin D daily.  Your vaccines are up to date.   

## 2016-07-01 NOTE — Telephone Encounter (Signed)
rx sent for 15 mg tablets.Loralee PacasBarkley, Jackqueline Aquilar GoodlandLynetta

## 2016-07-01 NOTE — Telephone Encounter (Signed)
Briana Castillo called and states she would rather have the caplets/tablets instead of the capsules. She would like to break them in half. Please advise.

## 2016-07-02 LAB — CYTOLOGY - PAP: Diagnosis: NEGATIVE

## 2016-07-02 NOTE — Progress Notes (Signed)
Call patient: Your Pap smear is normal. Repeat in 3 years.

## 2016-07-02 NOTE — Progress Notes (Signed)
All labs are normal. 

## 2016-07-03 ENCOUNTER — Other Ambulatory Visit: Payer: Self-pay | Admitting: *Deleted

## 2016-07-03 MED ORDER — PHENTERMINE HCL 37.5 MG PO TABS: ORAL_TABLET | ORAL | 0 refills | Status: DC

## 2016-07-29 IMAGING — MG MM DIAG BREAST TOMO UNI LEFT
4 series · 4 of 12 positions shown · non-contrast
Comparison: Previous exam(s).

CLINICAL DATA: Patient recalled from screening for left breast
asymmetry.

EXAM:
DIGITAL DIAGNOSTIC LEFT MAMMOGRAM WITH 3D TOMOSYNTHESIS AND CAD

[L CC]
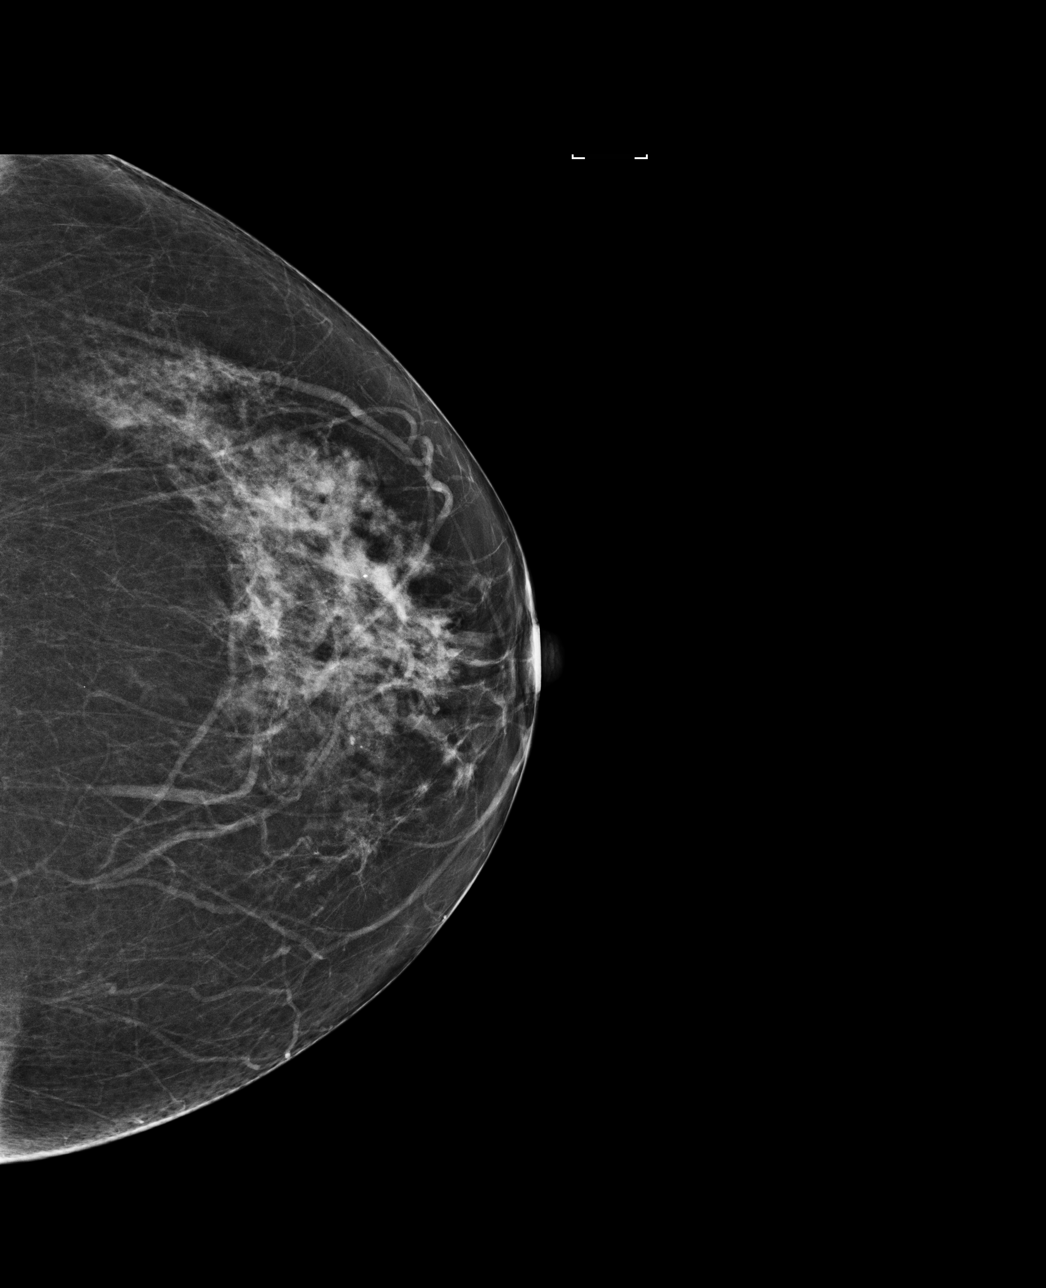

[L MLO]
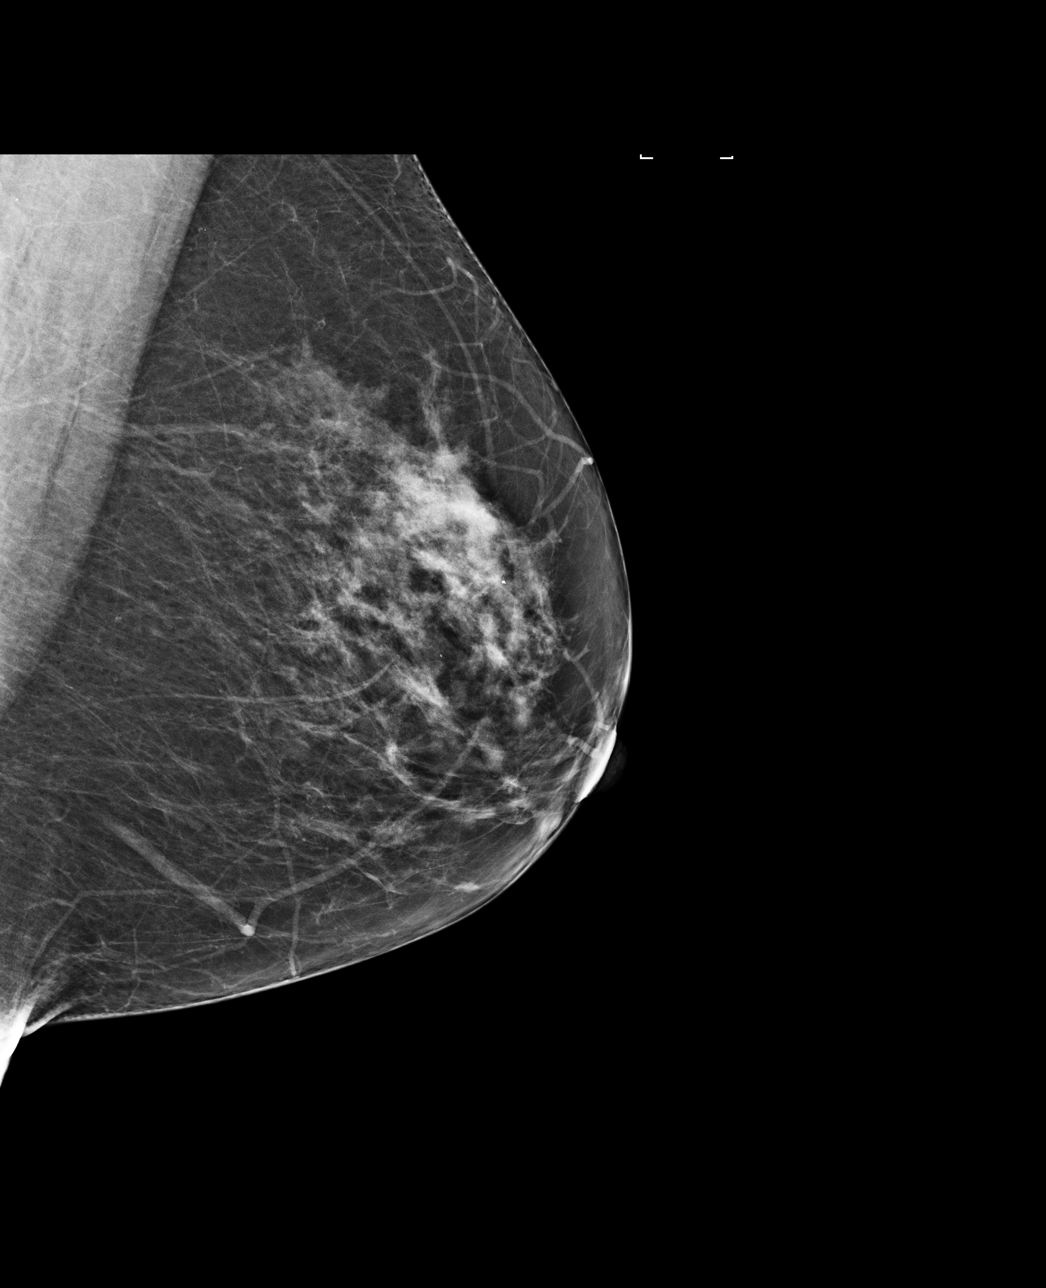

[L CC tomo · tomo slice 31/60.0]
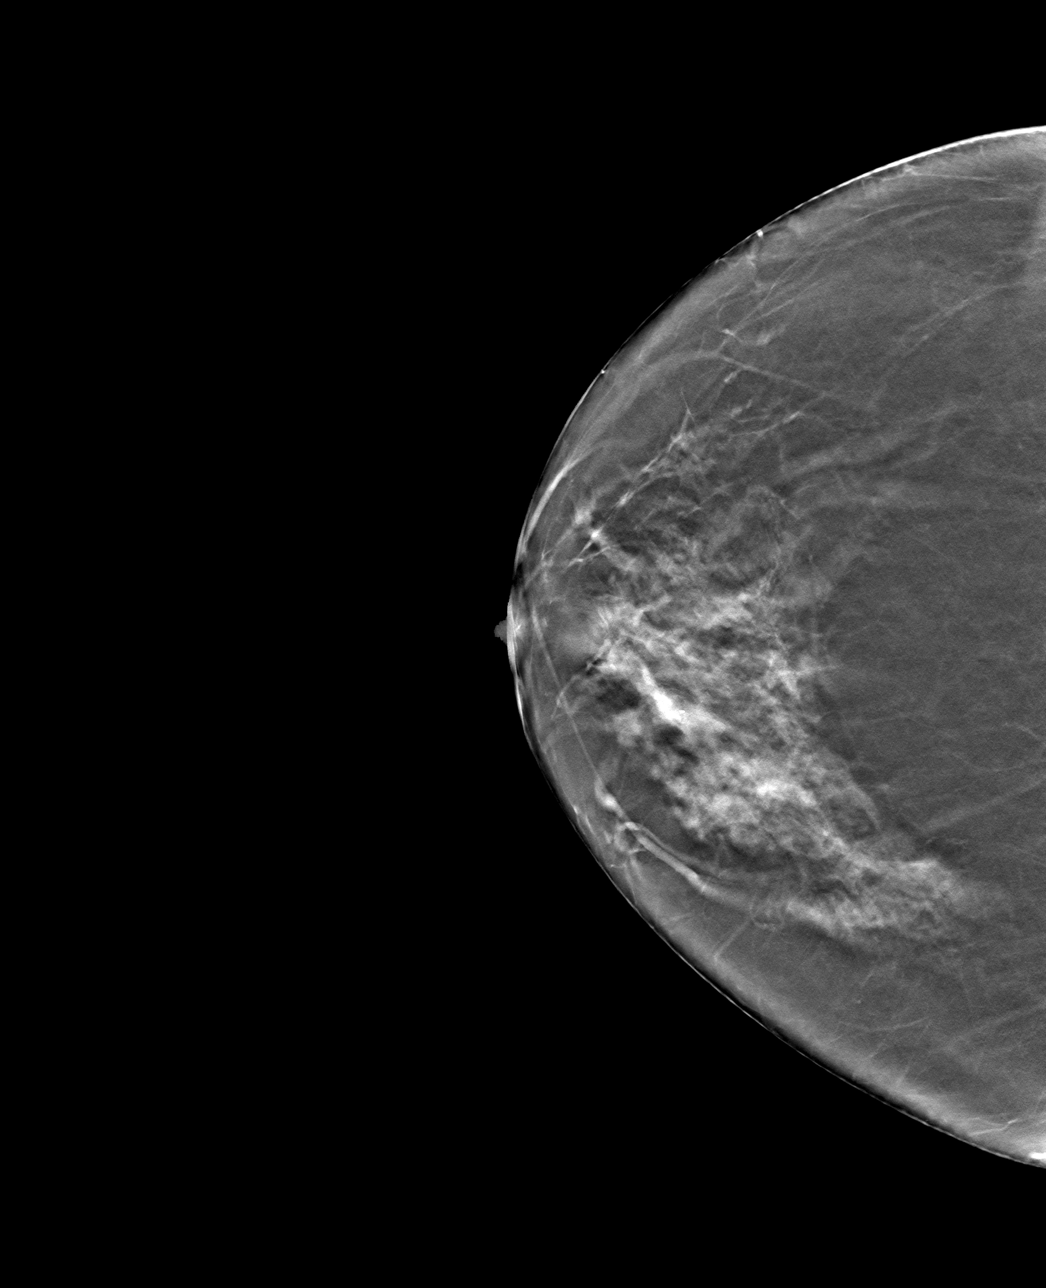

[L MLO tomo · tomo slice 30/59.0]
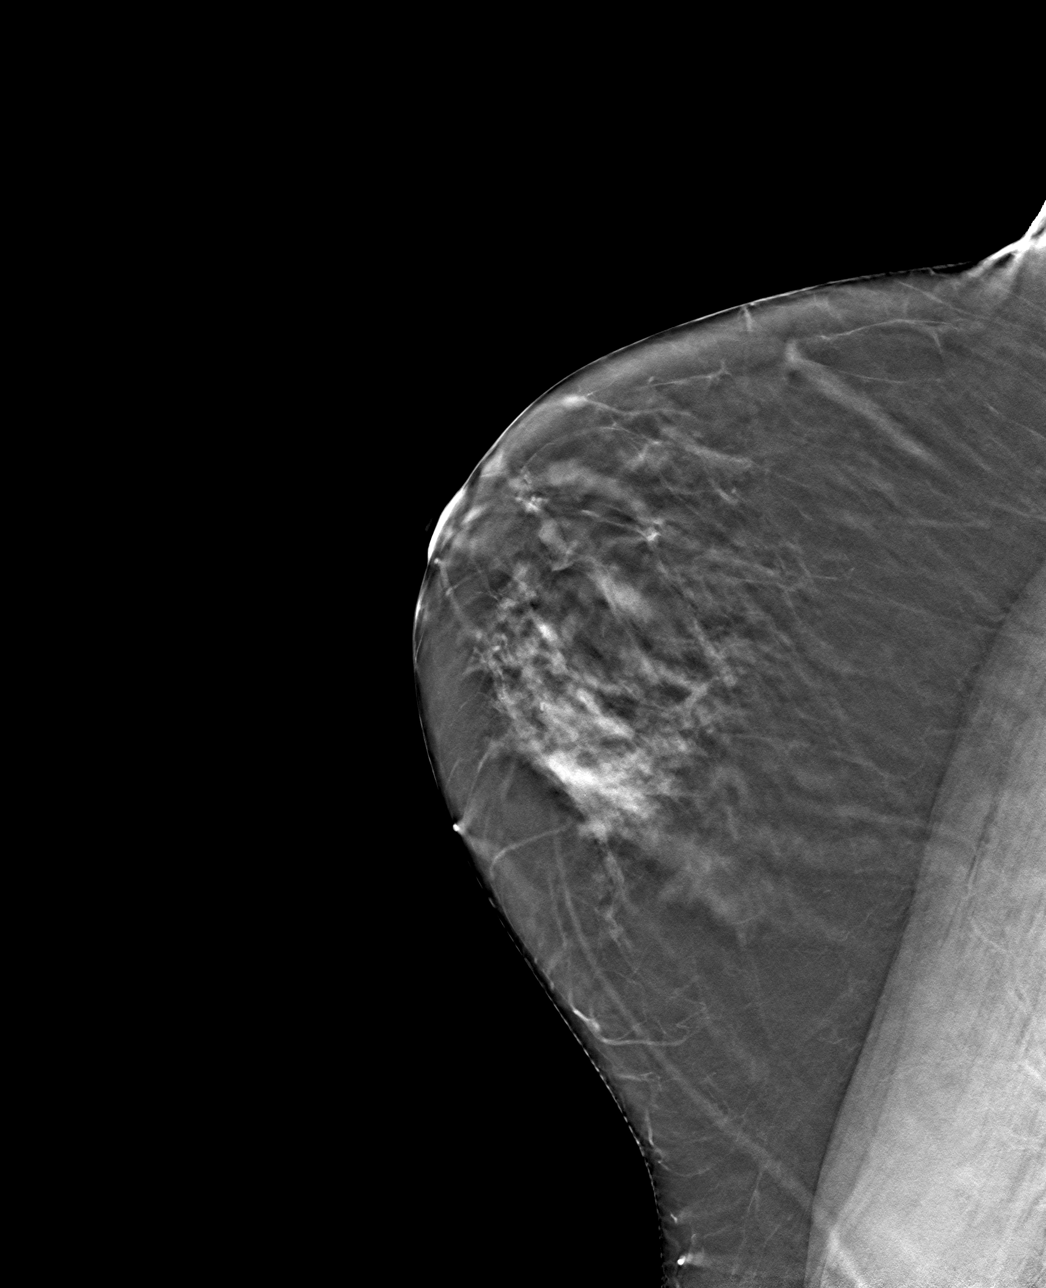

[4 of 12 positions shown; findings below may reference images not displayed]

ACR Breast Density Category c: The breast tissue is heterogeneously
dense, which may obscure small masses.
FINDINGS: CC and MLO tomosynthesis images of the left breast demonstrate the
questioned asymmetry within the lateral left breast anterior depth
to resolve with additional imaging, compatible with overlapping
fibroglandular breast tissue.

Mammographic images were processed with CAD.
IMPRESSION: Questioned left breast asymmetry resolved, compatible with
overlapping fibroglandular breast tissue.

RECOMMENDATION:
Screening mammogram in one year.(Code:[id number])

I have discussed the findings and recommendations with the patient.
Results were also provided in writing at the conclusion of the
visit. If applicable, a reminder letter will be sent to the patient
regarding the next appointment.

BI-RADS CATEGORY  1: Negative.

## 2016-08-12 ENCOUNTER — Other Ambulatory Visit: Payer: Self-pay | Admitting: *Deleted

## 2016-08-12 MED ORDER — VARENICLINE TARTRATE 1 MG PO TABS: 1.0000 mg | ORAL_TABLET | Freq: Two times a day (BID) | ORAL | 2 refills | Status: DC

## 2016-08-12 NOTE — Progress Notes (Signed)
chantix sent to pharmacy .Briana PacasBarkley, Briana Morocho HogelandLynetta

## 2017-02-13 ENCOUNTER — Other Ambulatory Visit: Payer: Self-pay

## 2017-02-13 ENCOUNTER — Emergency Department
Admission: EM | Admit: 2017-02-13 | Discharge: 2017-02-13 | Disposition: A | Discharge status: Home / Self Care | Payer: BLUE CROSS/BLUE SHIELD | Source: Home / Self Care | Attending: Family Medicine | Admitting: Family Medicine

## 2017-02-13 ENCOUNTER — Encounter: Payer: Self-pay | Admitting: Emergency Medicine

## 2017-02-13 DIAGNOSIS — T7840XA Allergy, unspecified, initial encounter: Secondary | ICD-10-CM

## 2017-02-13 MED ORDER — PREDNISONE 20 MG PO TABS: ORAL_TABLET | ORAL | 0 refills | Status: DC

## 2017-02-13 MED ORDER — DIPHENHYDRAMINE HCL 50 MG/ML IJ SOLN
50.0000 mg | Freq: Once | INTRAMUSCULAR | Status: AC
  Administered 2017-02-13: 50 mg via INTRAMUSCULAR

## 2017-02-13 MED ORDER — METHYLPREDNISOLONE SODIUM SUCC 125 MG IJ SOLR
80.0000 mg | Freq: Once | INTRAMUSCULAR | Status: AC
  Administered 2017-02-13: 80 mg via INTRAMUSCULAR

## 2017-02-13 NOTE — Discharge Instructions (Addendum)
Begin prednisone Sunday 02/14/17.  Continue an antihistamine such as Claritin daily. If symptoms become significantly worse during the night or over the weekend, proceed to the local emergency room.

## 2017-02-13 NOTE — ED Triage Notes (Addendum)
Patient presents to [facility] with itching and Shortness of Breath

## 2017-02-14 ENCOUNTER — Telehealth: Payer: Self-pay | Admitting: Emergency Medicine

## 2017-02-14 NOTE — Telephone Encounter (Signed)
Left VM inquiring about patient's status; encouraged her to call with questions/concerns. Will have staff attempt calling again tomorrow.

## 2017-02-15 NOTE — Progress Notes (Signed)
Subjective:    Patient ID: Briana Castillo, female    DOB: [birth year], 56 y.o.   MRN: [id number]  HPI  56 year old female comes in today to follow-up for urgent care visit on 9/15 for an allergic reaction.She had taken Celebrex little less than an hour before her symptoms actually started. She started to experience some itching in her eyes and says she rents them out. Then started noticing itchy red swollen hands. Then it spread to her feet and then it spread over her entire body. She did have some hives on her abdomen. She went to what she thought was a local urgent care but realized it was a regular doctor's office and that was closed so she drove to our local urgent care on Saturday morning the time she got here she was having difficulty speaking and felt short of breath.  She was given Benadryl 50 mg and methylprednisolone 80 mg. She was discharged home on prednisone taper. She says before she even left she actually felt much better and her voice was back to normal by bedtime that evening. She's been doing well since then. Interestingly, she had a similar reaction to penicillin about 20 years ago.  Review of Systems  BP 132/88   Pulse 88   Ht  (1.702 m)   Wt 204 lb (92.5 kg)   LMP 09/13/2011   SpO2 97%   BMI 31.95 kg/m     Allergies  Allergen Reactions  . Amoxicillin Shortness Of Breath and Rash    REACTION: Swelling  . Celebrex [Celecoxib] Shortness Of Breath and Rash  . Penicillins Shortness Of Breath and Rash    No past medical history on file.  Past Surgical History:  Procedure Laterality Date  . TUBAL LIGATION    . VARICOSE VEIN SURGERY     x 2    Social History   Social History  . Marital status: Divorced    Spouse name: N/A  . Number of children: 1  . Years of education: N/A   Occupational History  . 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    Social History Main Topics  . Smoking status: Current Some Day Smoker    Packs/day: 1.00    Types: Cigarettes   . Smokeless tobacco: Never Used  . Alcohol use 4.8 oz/week    8 Glasses of wine per week  . Drug use: No  . Sexual activity: Not on file     Comment: divorced, regular exercise.   Other Topics Concern  . Not on file   Social History Narrative   She drinks 2-3 caffeinated beverages daily. Regular exercise.    Family History  Problem Relation Age of Onset  . Diabetes Maternal Grandmother   . Heart failure Father 49       Deceased    Outpatient Encounter Prescriptions as of 02/16/2017  Medication Sig  . Estradiol-Norethindrone Acet 0.5-0.1 MG tablet Take 1 tablet by mouth daily.  . hydrochlorothiazide (HYDRODIURIL) 25 MG tablet Take 1 tablet (25 mg total) by mouth daily.  . predniSONE (DELTASONE) 20 MG tablet Take one tab by mouth twice daily for 4 days, then one daily for 3 days. Take with food.  . varenicline (CHANTIX CONTINUING MONTH PAK) 1 MG tablet Take 1 tablet (1 mg total) by mouth 2 (two) times daily.  . [DISCONTINUED] varenicline (CHANTIX CONTINUING MONTH PAK) 1 MG tablet Take 1 tablet (1 mg total) by mouth 2 (two) times daily.  Marland Kitchen EPINEPHrine 0.3 mg/0.3  mL IJ SOAJ injection Inject 0.3 mLs (0.3 mg total) into the muscle once.  . [DISCONTINUED] phentermine (ADIPEX-P) 37.5 MG tablet Take 1/2 tab daily.   No facility-administered encounter medications on file as of 02/16/2017.          Objective:   Physical Exam  Constitutional: She is oriented to person, place, and time. She appears well-developed and well-nourished.  HENT:  Head: Normocephalic and atraumatic.  Right Ear: External ear normal.  Left Ear: External ear normal.  Nose: Nose normal.  Mouth/Throat: Oropharynx is clear and moist.  TMs and canals are clear.   Eyes: Pupils are equal, round, and reactive to light. Conjunctivae and EOM are normal.  Neck: Neck supple. No thyromegaly present.  Cardiovascular: Normal rate, regular rhythm and normal heart sounds.   Pulmonary/Chest: Effort normal and breath sounds  normal. She has no wheezes.  Lymphadenopathy:    She has no cervical adenopathy.  Neurological: She is alert and oriented to person, place, and time.  Skin: Skin is warm and dry.  Psychiatric: She has a normal mood and affect.          Assessment & Plan:  Allergic reaction to medication-checks experienced loss of her voice and shortness of breath as well as high swelling and itching-added this to her allergy list. We'll also give her prescription for and epinephrine device but wonders if she ever has to use it then she absolutely has to call 911. Her symptoms currently have completely resolved. This happens again then consider referral to allergy specialist for further evaluation.  Tobacco abuse-she requests to have her Chantix refilled.

## 2017-02-16 ENCOUNTER — Ambulatory Visit (INDEPENDENT_AMBULATORY_CARE_PROVIDER_SITE_OTHER): Payer: BLUE CROSS/BLUE SHIELD | Admitting: Family Medicine

## 2017-02-16 ENCOUNTER — Encounter: Payer: Self-pay | Admitting: Family Medicine

## 2017-02-16 VITALS — BP 132/88 | HR 88 | Ht 67.0 in | Wt 204.0 lb

## 2017-02-16 DIAGNOSIS — Z720 Tobacco use: Secondary | ICD-10-CM

## 2017-02-16 DIAGNOSIS — T7840XA Allergy, unspecified, initial encounter: Secondary | ICD-10-CM | POA: Diagnosis not present

## 2017-02-16 MED ORDER — EPINEPHRINE 0.3 MG/0.3ML IJ SOAJ: 0.3000 mg | Freq: Once | INTRAMUSCULAR | 1 refills | Status: DC

## 2017-02-16 MED ORDER — VARENICLINE TARTRATE 1 MG PO TABS: 1.0000 mg | ORAL_TABLET | Freq: Two times a day (BID) | ORAL | 2 refills | Status: DC

## 2017-02-16 NOTE — Telephone Encounter (Signed)
Callback: Patient reports she is feeling much improved. She is on her way to her f/u apt with PCP now.

## 2017-02-20 NOTE — ED Provider Notes (Signed)
[facility]    CSN: [id number] Arrival date & time: 02/13/17  1136     History   Chief Complaint Chief Complaint  Patient presents with  . Allergic Reaction    HPI Briana Castillo is a 56 y.o. female.   Patient complains of sudden onset of generalized itching and slight rash on her hands, itching in her eyes, shortness of breath, hoarseness, and tightness in her chest.  No chest pain.  No difficulty swallowing.  She admits that she had taken some left-over Celebrex about 30 minutes prior.  She recalls a similar reaction to penicillin about 20 years ago    Allergic Reaction  Presenting symptoms: difficulty breathing, itching and rash   Presenting symptoms: no difficulty swallowing, no swelling and no wheezing   Difficulty breathing:    Severity:  Mild   Onset quality:  Sudden   Timing:  Constant   Progression:  Unchanged Itching:    Location:  Full body   Severity:  Moderate   Onset quality:  Sudden   Duration:  1 hour   Timing:  Constant   Progression:  Unchanged Rash:    Location:  Full body   Quality: itchiness     Severity:  Mild   Onset quality:  Sudden   Duration:  1 hour   Timing:  Constant   Progression:  Unchanged Severity:  Mild Duration:  30 minutes Prior allergic episodes:  Allergies to medications Context: medications   Relieved by:  None tried Worsened by:  Nothing Ineffective treatments:  None tried   No past medical history on file.  Patient Active Problem List   Diagnosis Date Noted  . Hormone replacement therapy (HRT) 07/01/2016  . Hyperlipidemia 04/22/2015  . Left knee pain 03/05/2015  . Trochanteric bursitis 03/05/2015  . Edema, peripheral 03/05/2015  . 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 HTN (hypertension) 12/25/2014  . Heart murmur 11/13/2010  . Anxiety 10/17/2010  . CORNEAL ABRASION, LEFT 08/27/2008    Past Surgical History:  Procedure Laterality Date  . TUBAL LIGATION    . VARICOSE VEIN SURGERY     x 2    OB History     No data available       Home Medications    Prior to Admission medications   Medication Sig Start Date End Date Taking? Authorizing Provider  Estradiol-Norethindrone Acet 0.5-0.1 MG tablet Take 1 tablet by mouth daily. 07/01/16   Agapito Games, MD  hydrochlorothiazide (HYDRODIURIL) 25 MG tablet Take 1 tablet (25 mg total) by mouth daily. 07/01/16   Agapito Games, MD  predniSONE (DELTASONE) 20 MG tablet Take one tab by mouth twice daily for 4 days, then one daily for 3 days. Take with food. 02/13/17   Lattie Haw, MD  varenicline (CHANTIX CONTINUING MONTH PAK) 1 MG tablet Take 1 tablet (1 mg total) by mouth 2 (two) times daily. 02/16/17   Agapito Games, MD    Family History Family History  Problem Relation Age of Onset  . Diabetes Maternal Grandmother   . Heart failure Father 61       Deceased    Social History Social History  Substance Use Topics  . Smoking status: Current Some Day Smoker    Packs/day: 1.00    Types: Cigarettes  . Smokeless tobacco: Never Used  . Alcohol use 4.8 oz/week    8 Glasses of wine per week     Allergies   Amoxicillin; Celebrex [celecoxib]; and Penicillins  Review of Systems Review of Systems  HENT: Negative for trouble swallowing.   Respiratory: Negative for wheezing.   Skin: Positive for itching and rash.  All other systems reviewed and are negative.    Physical Exam Triage Vital Signs ED Triage Vitals [02/13/17 1203]  Enc Vitals Group     BP 115/88     Pulse Rate 88     Resp      Temp 97.8 F (36.6 C)     Temp Source Oral     SpO2 95 %     Weight      Height      Head Circumference      Peak Flow      Pain Score      Pain Loc      Pain Edu?      Excl. in GC?    No data found.   Updated Vital Signs BP 115/88 (BP Location: Right Arm)   Pulse 88   Temp 97.8 F (36.6 C) (Oral)   LMP 09/13/2011   SpO2 95%   Visual Acuity Right Eye Distance:   Left Eye Distance:   Bilateral  Distance:    Right Eye Near:   Left Eye Near:    Bilateral Near:     Physical Exam Nursing notes and Vital Signs reviewed. Appearance:  Patient appears stated age, and in no acute distress Eyes:  Pupils are equal, round, and reactive to light and accomodation.  Extraocular movement is intact.  Conjunctivae are not inflamed  Ears:  Canals normal.  Tympanic membranes normal.  Nose:  Normal turbinates.  No sinus tenderness.    Mouth:  No lip swelling.  Tongue midline. Pharynx:  Normal Neck:  Supple.  No adenopathy. Lungs:  Clear to auscultation.  Breath sounds are equal.  Moving air well. Heart:  Regular rate and rhythm without murmurs, rubs, or gallops.  Abdomen:  Nontender without masses or hepatosplenomegaly.  Bowel sounds are present.  No CVA or flank tenderness.  Extremities:  No edema.  Skin:  No rash present.    UC Treatments / Results  Labs (all labs ordered are listed, but only abnormal results are displayed) Labs Reviewed - No data to display  EKG  EKG Interpretation  Rate:  78 BPM PR:  168 msec QT:  432 msec QTcH:  492 msec QRSD:  98 msec QRS axis:  34 degrees Interpretation:  Normal sinus rhythm; possible left atrial enlargement; Non-specific ST abnormality. No acute changes.  No significant change from EKG done 01/20/1999       Radiology No results found.  Procedures Procedures (including critical care time)  Medications Ordered in UC Medications  diphenhydrAMINE (BENADRYL) injection 50 mg (50 mg Intramuscular Given 02/13/17 1211)  methylPREDNISolone sodium succinate (SOLU-MEDROL) 125 mg/2 mL injection 80 mg (80 mg Intramuscular Given 02/13/17 1223)     Initial Impression / Assessment and Plan / UC Course  I have reviewed the triage vital signs and the nursing notes.  Pertinent labs & imaging results that were available during my care of the patient were reviewed by me and considered in my medical decision making (see chart for details).    Allergic  reaction ?etiology. No acute EKG changes.  No evidence anaphylaxis. Administered Solumedrol  IM  Administered Benadryl  IM, and Pepcid  po with resolution of symptoms.  Begin prednisone Sunday 02/14/17.  Continue an antihistamine such as Claritin daily. If symptoms become significantly worse during the night or over the weekend,  proceed to the local emergency room.  Followup with Family Doctor.    Final Clinical Impressions(s) / UC Diagnoses   Final diagnoses:  Allergic reaction, initial encounter    New Prescriptions Discharge Medication List as of 02/13/2017 12:49 PM    START taking these medications   Details  predniSONE (DELTASONE) 20 MG tablet Take one tab by mouth twice daily for 4 days, then one daily for 3 days. Take with food., Print             Lattie Haw, MD 02/20/17 1236

## 2017-05-19 ENCOUNTER — Other Ambulatory Visit: Payer: Self-pay | Admitting: Family Medicine

## 2017-05-19 ENCOUNTER — Other Ambulatory Visit: Payer: Self-pay | Admitting: *Deleted

## 2017-05-19 MED ORDER — HYDROCHLOROTHIAZIDE 25 MG PO TABS: 25.0000 mg | ORAL_TABLET | Freq: Every day | ORAL | 0 refills | Status: DC

## 2017-05-19 MED ORDER — VARENICLINE TARTRATE 1 MG PO TABS: 1.0000 mg | ORAL_TABLET | Freq: Two times a day (BID) | ORAL | 2 refills | Status: DC

## 2017-05-20 ENCOUNTER — Other Ambulatory Visit: Payer: Self-pay | Admitting: Family Medicine

## 2017-06-16 ENCOUNTER — Telehealth: Payer: Self-pay | Admitting: Family Medicine

## 2017-06-16 NOTE — Telephone Encounter (Signed)
Called patient and informed her that she is overdue for a F/U on her HTN with Dr.Metheney and she states she will call back to schedule

## 2017-06-20 ENCOUNTER — Other Ambulatory Visit: Payer: Self-pay | Admitting: Family Medicine

## 2017-09-23 ENCOUNTER — Telehealth: Payer: Self-pay | Admitting: Family Medicine

## 2017-09-23 DIAGNOSIS — E782 Mixed hyperlipidemia: Secondary | ICD-10-CM

## 2017-09-23 DIAGNOSIS — Z0000 Encounter for general adult medical examination without abnormal findings: Secondary | ICD-10-CM

## 2017-09-23 DIAGNOSIS — Z1239 Encounter for other screening for malignant neoplasm of breast: Secondary | ICD-10-CM

## 2017-09-23 NOTE — Telephone Encounter (Signed)
OK for CMP, lipid, CBC and ok for tomo mamm.thank you

## 2017-09-23 NOTE — Telephone Encounter (Signed)
Pt has a physical scheduled for May 8th and would like to get her labs done before hand and also would like to get a referral for a mammogram. Thanks

## 2017-09-24 NOTE — Telephone Encounter (Signed)
Patient advised and orders placed.  

## 2017-10-01 ENCOUNTER — Ambulatory Visit: Payer: BLUE CROSS/BLUE SHIELD

## 2017-10-06 ENCOUNTER — Ambulatory Visit (INDEPENDENT_AMBULATORY_CARE_PROVIDER_SITE_OTHER): Payer: BLUE CROSS/BLUE SHIELD

## 2017-10-06 ENCOUNTER — Encounter: Payer: Self-pay | Admitting: Family Medicine

## 2017-10-06 ENCOUNTER — Ambulatory Visit (INDEPENDENT_AMBULATORY_CARE_PROVIDER_SITE_OTHER): Payer: BLUE CROSS/BLUE SHIELD | Admitting: Family Medicine

## 2017-10-06 VITALS — BP 122/78 | HR 66 | Ht 67.0 in | Wt 209.0 lb

## 2017-10-06 DIAGNOSIS — Z720 Tobacco use: Secondary | ICD-10-CM | POA: Diagnosis not present

## 2017-10-06 DIAGNOSIS — R011 Cardiac murmur, unspecified: Secondary | ICD-10-CM

## 2017-10-06 DIAGNOSIS — Z79890 Hormone replacement therapy (postmenopausal): Secondary | ICD-10-CM | POA: Diagnosis not present

## 2017-10-06 DIAGNOSIS — E782 Mixed hyperlipidemia: Secondary | ICD-10-CM | POA: Diagnosis not present

## 2017-10-06 DIAGNOSIS — I358 Other nonrheumatic aortic valve disorders: Secondary | ICD-10-CM

## 2017-10-06 DIAGNOSIS — I10 Essential (primary) hypertension: Secondary | ICD-10-CM

## 2017-10-06 DIAGNOSIS — Z0000 Encounter for general adult medical examination without abnormal findings: Secondary | ICD-10-CM

## 2017-10-06 DIAGNOSIS — Z1231 Encounter for screening mammogram for malignant neoplasm of breast: Secondary | ICD-10-CM

## 2017-10-06 DIAGNOSIS — Z1239 Encounter for other screening for malignant neoplasm of breast: Secondary | ICD-10-CM

## 2017-10-06 LAB — CBC WITH DIFFERENTIAL/PLATELET
Basophils Absolute: 32 cells/uL (ref 0–200)
Basophils Relative: 0.5 %
EOS PCT: 3.9 %
Eosinophils Absolute: 250 cells/uL (ref 15–500)
HEMATOCRIT: 38.4 % (ref 35.0–45.0)
HEMOGLOBIN: 12.8 g/dL (ref 11.7–15.5)
LYMPHS ABS: 1997 {cells}/uL (ref 850–3900)
MCH: 31.4 pg (ref 27.0–33.0)
MCHC: 33.3 g/dL (ref 32.0–36.0)
MCV: 94.3 fL (ref 80.0–100.0)
MONOS PCT: 7.6 %
MPV: 9.9 fL (ref 7.5–12.5)
NEUTROS ABS: 3635 {cells}/uL (ref 1500–7800)
Neutrophils Relative %: 56.8 %
Platelets: 224 10*3/uL (ref 140–400)
RBC: 4.07 10*6/uL (ref 3.80–5.10)
RDW: 12.9 % (ref 11.0–15.0)
Total Lymphocyte: 31.2 %
WBC mixed population: 486 cells/uL (ref 200–950)
WBC: 6.4 10*3/uL (ref 3.8–10.8)

## 2017-10-06 LAB — COMPLETE METABOLIC PANEL WITH GFR
AG Ratio: 1.6 (calc) (ref 1.0–2.5)
ALT: 14 U/L (ref 6–29)
AST: 19 U/L (ref 10–35)
Albumin: 4.1 g/dL (ref 3.6–5.1)
Alkaline phosphatase (APISO): 78 U/L (ref 33–130)
BUN/Creatinine Ratio: 17 (calc) (ref 6–22)
BUN: 18 mg/dL (ref 7–25)
CALCIUM: 9.3 mg/dL (ref 8.6–10.4)
CO2: 24 mmol/L (ref 20–32)
CREATININE: 1.07 mg/dL — AB (ref 0.50–1.05)
Chloride: 107 mmol/L (ref 98–110)
GFR, EST NON AFRICAN AMERICAN: 58 mL/min/{1.73_m2} — AB (ref >=60)
GFR, Est African American: 67 mL/min/{1.73_m2} (ref >=60)
GLOBULIN: 2.5 g/dL (ref 1.9–3.7)
GLUCOSE: 95 mg/dL (ref 65–99)
Potassium: 4.2 mmol/L (ref 3.5–5.3)
SODIUM: 143 mmol/L (ref 135–146)
Total Bilirubin: 0.6 mg/dL (ref 0.2–1.2)
Total Protein: 6.6 g/dL (ref 6.1–8.1)

## 2017-10-06 LAB — LIPID PANEL W/REFLEX DIRECT LDL
Cholesterol: 252 mg/dL — ABNORMAL HIGH (ref <200)
HDL: 69 mg/dL (ref >50)
LDL CHOLESTEROL (CALC): 155 mg/dL — AB
Non-HDL Cholesterol (Calc): 183 mg/dL (calc) — ABNORMAL HIGH (ref <130)
TRIGLYCERIDES: 149 mg/dL (ref <150)
Total CHOL/HDL Ratio: 3.7 (calc) (ref <5.0)

## 2017-10-06 MED ORDER — VARENICLINE TARTRATE 1 MG PO TABS: 1.0000 mg | ORAL_TABLET | Freq: Two times a day (BID) | ORAL | 2 refills | Status: DC

## 2017-10-06 MED ORDER — ALPRAZOLAM 0.5 MG PO TABS: 0.5000 mg | ORAL_TABLET | Freq: Every day | ORAL | 1 refills | Status: DC | PRN

## 2017-10-06 MED ORDER — HYDROCHLOROTHIAZIDE 25 MG PO TABS: 25.0000 mg | ORAL_TABLET | Freq: Every day | ORAL | 1 refills | Status: DC

## 2017-10-06 MED ORDER — ESTRADIOL-NORETHINDRONE ACET 0.5-0.1 MG PO TABS: 1.0000 | ORAL_TABLET | Freq: Every day | ORAL | 12 refills | Status: DC

## 2017-10-06 NOTE — Progress Notes (Signed)
Subjective:     Briana Castillo is a 57 y.o. female and is here for a comprehensive physical exam. The patient reports no problems.  Been exercising some but plans on getting back into the gym.  She did run out of her hormone pills and has been off of them for a couple of months but has started having night sweats again.  She would like to consider restarting it but may be taking it every other day.  Social History   Socioeconomic History  . Marital status: Divorced    Spouse name: Not on file  . Number of children: 1  . Years of education: Not on file  . Highest education level: Not on file  Occupational History  . 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   Social Needs  . Financial resource strain: Not on file  . Food insecurity:    Worry: Not on file    Inability: Not on file  . Transportation needs:    Medical: Not on file    Non-medical: Not on file  Tobacco Use  . Smoking status: Current Some Day Smoker    Packs/day: 1.00    Types: Cigarettes  . Smokeless tobacco: Never Used  Substance and Sexual Activity  . Alcohol use: Yes    Alcohol/week: 4.8 oz    Types: 8 Glasses of wine per week  . Drug use: No  . Sexual activity: Not on file    Comment: divorced, regular exercise.  Lifestyle  . Physical activity:    Days per week: Not on file    Minutes per session: Not on file  . Stress: Not on file  Relationships  . Social connections:    Talks on phone: Not on file    Gets together: Not on file    Attends religious service: Not on file    Active member of club or organization: Not on file    Attends meetings of clubs or organizations: Not on file    Relationship status: Not on file  . Intimate partner violence:    Fear of current or ex partner: Not on file    Emotionally abused: Not on file    Physically abused: Not on file    Forced sexual activity: Not on file  Other Topics Concern  . Not on file  Social History Narrative   She drinks 2-3  caffeinated beverages daily. Regular exercise.   Health Maintenance  Topic Date Due  . INFLUENZA VACCINE  08/30/2018 (Originally 12/30/2017)  . MAMMOGRAM  07/01/2018  . PAP SMEAR  07/02/2019  . TETANUS/TDAP  11/12/2020  . COLONOSCOPY  01/30/2021  . Hepatitis C Screening  Completed  . HIV Screening  Completed    The following portions of the patient's history were reviewed and updated as appropriate: allergies, current medications, past family history, past medical history, past social history, past surgical history and problem list.  Review of Systems A comprehensive review of systems was negative.   Objective:    BP 122/78   Pulse 66   Ht  (1.702 m)   Wt 209 lb (94.8 kg)   LMP 09/13/2011   SpO2 100%   BMI 32.73 kg/m  General appearance: alert, cooperative and appears older than stated age Head: Normocephalic, without obvious abnormality, atraumatic Eyes: conj clear, EOMI, PEERLA Ears: normal TM's and external ear canals both ears Nose: Nares normal. Septum midline. Mucosa normal. No drainage or sinus tenderness. Throat: lips, mucosa, and tongue normal; teeth  and gums normal Neck: no adenopathy, no carotid bruit, no JVD, supple, symmetrical, trachea midline and thyroid not enlarged, symmetric, no tenderness/mass/nodules Back: symmetric, no curvature. ROM normal. No CVA tenderness. Lungs: clear to auscultation bilaterally Breasts: normal appearance, no masses or tenderness Heart: RRR with 3/6 SEM Abdomen: soft, non-tender; bowel sounds normal; no masses,  no organomegaly Extremities: extremities normal, atraumatic, no cyanosis or edema Pulses: 2+ and symmetric Skin: Skin color, texture, turgor normal. No rashes or lesions Lymph nodes: Cervical, supraclavicular, and axillary nodes normal. Neurologic: Alert and oriented X 3, normal strength and tone. Normal symmetric reflexes. Normal coordination and gait    Assessment:    Healthy female exam.      Plan:     See  After Visit Summary for Counseling Recommendations   Keep up a regular exercise program and make sure you are eating a healthy diet Try to eat 4 servings of dairy a day, or if you are lactose intolerant take a calcium with vitamin D daily.  Your vaccines are up to date.   HRT - we will refill medication but did encourage her to take it every other day.  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  Last one was performed in 2014 showing some aortic sclerosis with moderate regurgitation.

## 2017-10-06 NOTE — Patient Instructions (Signed)

## 2017-10-07 ENCOUNTER — Other Ambulatory Visit: Payer: Self-pay | Admitting: *Deleted

## 2017-10-07 DIAGNOSIS — R7989 Other specified abnormal findings of blood chemistry: Secondary | ICD-10-CM

## 2017-10-28 ENCOUNTER — Other Ambulatory Visit: Payer: Self-pay | Admitting: Family Medicine

## 2017-10-28 DIAGNOSIS — Z720 Tobacco use: Secondary | ICD-10-CM

## 2017-12-07 ENCOUNTER — Other Ambulatory Visit: Payer: Self-pay | Admitting: Family Medicine

## 2017-12-07 DIAGNOSIS — Z720 Tobacco use: Secondary | ICD-10-CM

## 2018-01-05 ENCOUNTER — Other Ambulatory Visit: Payer: Self-pay | Admitting: Family Medicine

## 2018-01-05 DIAGNOSIS — Z720 Tobacco use: Secondary | ICD-10-CM

## 2018-03-04 ENCOUNTER — Other Ambulatory Visit: Payer: Self-pay | Admitting: Family Medicine

## 2018-03-04 DIAGNOSIS — Z720 Tobacco use: Secondary | ICD-10-CM

## 2018-03-27 ENCOUNTER — Other Ambulatory Visit: Payer: Self-pay | Admitting: Family Medicine

## 2018-03-27 DIAGNOSIS — I10 Essential (primary) hypertension: Secondary | ICD-10-CM

## 2018-03-31 ENCOUNTER — Other Ambulatory Visit: Payer: Self-pay | Admitting: Family Medicine

## 2018-03-31 DIAGNOSIS — Z720 Tobacco use: Secondary | ICD-10-CM

## 2018-04-11 ENCOUNTER — Ambulatory Visit: Payer: BLUE CROSS/BLUE SHIELD | Admitting: Family Medicine

## 2018-04-12 DIAGNOSIS — R7989 Other specified abnormal findings of blood chemistry: Secondary | ICD-10-CM | POA: Diagnosis not present

## 2018-04-12 LAB — COMPLETE METABOLIC PANEL WITH GFR
AG Ratio: 2 (calc) (ref 1.0–2.5)
ALBUMIN MSPROF: 4.3 g/dL (ref 3.6–5.1)
ALKALINE PHOSPHATASE (APISO): 73 U/L (ref 33–130)
ALT: 16 U/L (ref 6–29)
AST: 17 U/L (ref 10–35)
BUN: 15 mg/dL (ref 7–25)
CO2: 27 mmol/L (ref 20–32)
CREATININE: 0.91 mg/dL (ref 0.50–1.05)
Calcium: 9 mg/dL (ref 8.6–10.4)
Chloride: 107 mmol/L (ref 98–110)
GFR, Est African American: 81 mL/min/{1.73_m2} (ref >=60)
GFR, Est Non African American: 70 mL/min/{1.73_m2} (ref >=60)
GLUCOSE: 97 mg/dL (ref 65–99)
Globulin: 2.2 g/dL (calc) (ref 1.9–3.7)
Potassium: 3.8 mmol/L (ref 3.5–5.3)
Sodium: 140 mmol/L (ref 135–146)
Total Bilirubin: 0.6 mg/dL (ref 0.2–1.2)
Total Protein: 6.5 g/dL (ref 6.1–8.1)

## 2018-04-14 ENCOUNTER — Encounter: Payer: Self-pay | Admitting: Family Medicine

## 2018-04-14 ENCOUNTER — Ambulatory Visit (INDEPENDENT_AMBULATORY_CARE_PROVIDER_SITE_OTHER): Payer: BLUE CROSS/BLUE SHIELD | Admitting: Family Medicine

## 2018-04-14 VITALS — BP 130/78 | HR 57 | Ht 65.06 in | Wt 208.0 lb

## 2018-04-14 DIAGNOSIS — F419 Anxiety disorder, unspecified: Secondary | ICD-10-CM

## 2018-04-14 DIAGNOSIS — Z720 Tobacco use: Secondary | ICD-10-CM | POA: Diagnosis not present

## 2018-04-14 DIAGNOSIS — Z23 Encounter for immunization: Secondary | ICD-10-CM

## 2018-04-14 DIAGNOSIS — I10 Essential (primary) hypertension: Secondary | ICD-10-CM

## 2018-04-14 MED ORDER — ESTRADIOL-NORETHINDRONE ACET 0.5-0.1 MG PO TABS: 1.0000 | ORAL_TABLET | Freq: Every day | ORAL | 3 refills | Status: DC

## 2018-04-14 MED ORDER — HYDROCHLOROTHIAZIDE 25 MG PO TABS: 25.0000 mg | ORAL_TABLET | Freq: Every day | ORAL | 2 refills | Status: DC

## 2018-04-14 NOTE — Progress Notes (Signed)
Subjective:    CC: Bp and anxiety  HPI:  Hypertension- Pt denies chest pain, SOB, dizziness, or heart palpitations.  Taking meds as directed w/o problems.  Denies medication side effects.    F/u anxiety -doing well overall.  She uses her Xanax sparingly.  She says she still has a lot left of which she filled in May.  She usually spits them in half if she does take them.  Smoking cessation-she is currently on Chantix to help her quit smoking.  Past medical history, Surgical history, Family history not pertinant except as noted below, Social history, Allergies, and medications have been entered into the medical record, reviewed, and corrections made.   Review of Systems: No fevers, chills, night sweats, weight loss, chest pain, or shortness of breath.   Objective:    General: Well Developed, well nourished, and in no acute distress.  Neuro: Alert and oriented x3, extra-ocular muscles intact, sensation grossly intact.  HEENT: Normocephalic, atraumatic  Skin: Warm and dry, no rashes. Cardiac: Regular rate and rhythm, no murmurs rubs or gallops, no lower extremity edema.  Respiratory: Clear to auscultation bilaterally. Not using accessory muscles, speaking in full sentences.   Impression and Recommendations:    HTN - Well controlled. Continue current regimen. Follow up in  6months.    Anxiety -doing well using her Xanax sparingly.  She does not need a refill today.  Smoking cessation-keep up the good work.  She quit around Labor Day and is been doing great so far.  Hormone replacement therapy-she would prefer to have her prescription sent for 90-day supplies her new prescription sent.  Flu vaccine given today.

## 2018-04-15 DIAGNOSIS — H52223 Regular astigmatism, bilateral: Secondary | ICD-10-CM | POA: Diagnosis not present

## 2018-04-23 ENCOUNTER — Other Ambulatory Visit: Payer: Self-pay | Admitting: Family Medicine

## 2018-04-23 DIAGNOSIS — Z720 Tobacco use: Secondary | ICD-10-CM

## 2018-05-19 ENCOUNTER — Other Ambulatory Visit: Payer: Self-pay | Admitting: Family Medicine

## 2018-05-19 DIAGNOSIS — Z720 Tobacco use: Secondary | ICD-10-CM

## 2018-10-13 ENCOUNTER — Ambulatory Visit: Payer: BLUE CROSS/BLUE SHIELD | Admitting: Family Medicine

## 2019-03-18 ENCOUNTER — Other Ambulatory Visit: Payer: Self-pay | Admitting: Family Medicine

## 2019-03-24 ENCOUNTER — Other Ambulatory Visit: Payer: Self-pay | Admitting: Family Medicine

## 2019-03-24 DIAGNOSIS — I10 Essential (primary) hypertension: Secondary | ICD-10-CM

## 2019-04-18 ENCOUNTER — Other Ambulatory Visit: Payer: Self-pay | Admitting: Family Medicine

## 2019-04-18 DIAGNOSIS — I10 Essential (primary) hypertension: Secondary | ICD-10-CM

## 2019-05-11 ENCOUNTER — Other Ambulatory Visit: Payer: Self-pay | Admitting: Family Medicine

## 2019-05-11 ENCOUNTER — Encounter: Payer: Self-pay | Admitting: *Deleted

## 2019-05-11 DIAGNOSIS — I10 Essential (primary) hypertension: Secondary | ICD-10-CM

## 2019-05-27 ENCOUNTER — Other Ambulatory Visit: Payer: Self-pay | Admitting: Family Medicine

## 2019-05-27 DIAGNOSIS — I10 Essential (primary) hypertension: Secondary | ICD-10-CM

## 2019-06-14 ENCOUNTER — Telehealth: Payer: Self-pay | Admitting: Family Medicine

## 2019-06-14 DIAGNOSIS — Z0000 Encounter for general adult medical examination without abnormal findings: Secondary | ICD-10-CM

## 2019-06-14 DIAGNOSIS — E782 Mixed hyperlipidemia: Secondary | ICD-10-CM

## 2019-06-14 NOTE — Telephone Encounter (Signed)
Patient wanted labs ordered so she can complete them before her physical.

## 2019-06-15 NOTE — Telephone Encounter (Signed)
Labs ordered. She will need to go fasting. It is an 8 hour fasting with nothing to drink or eat with the exception of water.

## 2019-06-22 ENCOUNTER — Other Ambulatory Visit: Payer: Self-pay | Admitting: Family Medicine

## 2019-06-22 DIAGNOSIS — Z1231 Encounter for screening mammogram for malignant neoplasm of breast: Secondary | ICD-10-CM

## 2019-06-28 ENCOUNTER — Other Ambulatory Visit: Payer: Self-pay

## 2019-06-28 ENCOUNTER — Other Ambulatory Visit (HOSPITAL_COMMUNITY)
Admission: RE | Admit: 2019-06-28 | Discharge: 2019-06-28 | Disposition: A | Discharge status: Home / Self Care | Payer: BC Managed Care – PPO | Source: Ambulatory Visit | Attending: Family Medicine | Admitting: Family Medicine

## 2019-06-28 ENCOUNTER — Encounter: Payer: Self-pay | Admitting: Family Medicine

## 2019-06-28 ENCOUNTER — Ambulatory Visit (INDEPENDENT_AMBULATORY_CARE_PROVIDER_SITE_OTHER): Payer: BC Managed Care – PPO | Admitting: Family Medicine

## 2019-06-28 ENCOUNTER — Ambulatory Visit (INDEPENDENT_AMBULATORY_CARE_PROVIDER_SITE_OTHER): Payer: BC Managed Care – PPO

## 2019-06-28 VITALS — BP 128/84 | HR 72 | Ht 65.0 in | Wt 202.0 lb

## 2019-06-28 DIAGNOSIS — Z0000 Encounter for general adult medical examination without abnormal findings: Secondary | ICD-10-CM

## 2019-06-28 DIAGNOSIS — Z720 Tobacco use: Secondary | ICD-10-CM

## 2019-06-28 DIAGNOSIS — I10 Essential (primary) hypertension: Secondary | ICD-10-CM

## 2019-06-28 DIAGNOSIS — Z1231 Encounter for screening mammogram for malignant neoplasm of breast: Secondary | ICD-10-CM | POA: Diagnosis not present

## 2019-06-28 DIAGNOSIS — R011 Cardiac murmur, unspecified: Secondary | ICD-10-CM

## 2019-06-28 DIAGNOSIS — Z124 Encounter for screening for malignant neoplasm of cervix: Secondary | ICD-10-CM

## 2019-06-28 MED ORDER — ALPRAZOLAM 0.5 MG PO TABS: 0.5000 mg | ORAL_TABLET | Freq: Every day | ORAL | 1 refills | Status: DC | PRN

## 2019-06-28 MED ORDER — VARENICLINE TARTRATE 1 MG PO TABS: 1.0000 mg | ORAL_TABLET | Freq: Two times a day (BID) | ORAL | 2 refills | Status: DC

## 2019-06-28 MED ORDER — HYDROCHLOROTHIAZIDE 25 MG PO TABS: 25.0000 mg | ORAL_TABLET | Freq: Every day | ORAL | 1 refills | Status: DC

## 2019-06-28 NOTE — Patient Instructions (Signed)
Health Maintenance, Female Adopting a healthy lifestyle and getting preventive care are important in promoting health and wellness. Ask your health care provider about:  The right schedule for you to have regular tests and exams.  Things you can do on your own to prevent diseases and keep yourself healthy. What should I know about diet, weight, and exercise? Eat a healthy diet   Eat a diet that includes plenty of vegetables, fruits, low-fat dairy products, and lean protein.  Do not eat a lot of foods that are high in solid fats, added sugars, or sodium. Maintain a healthy weight Body mass index (BMI) is used to identify weight problems. It estimates body fat based on height and weight. Your health care provider can help determine your BMI and help you achieve or maintain a healthy weight. Get regular exercise Get regular exercise. This is one of the most important things you can do for your health. Most adults should:  Exercise for at least 150 minutes each week. The exercise should increase your heart rate and make you sweat (moderate-intensity exercise).  Do strengthening exercises at least twice a week. This is in addition to the moderate-intensity exercise.  Spend less time sitting. Even light physical activity can be beneficial. Watch cholesterol and blood lipids Have your blood tested for lipids and cholesterol at 60 years of age, then have this test every 5 years. Have your cholesterol levels checked more often if:  Your lipid or cholesterol levels are high.  You are older than 60 years of age.  You are at high risk for heart disease. What should I know about cancer screening? Depending on your health history and family history, you may need to have cancer screening at various ages. This may include screening for:  Breast cancer.  Cervical cancer.  Colorectal cancer.  Skin cancer.  Lung cancer. What should I know about heart disease, diabetes, and high blood  pressure? Blood pressure and heart disease  High blood pressure causes heart disease and increases the risk of stroke. This is more likely to develop in people who have high blood pressure readings, are of African descent, or are overweight.  Have your blood pressure checked: ? Every 3-5 years if you are 18-39 years of age. ? Every year if you are 40 years old or older. Diabetes Have regular diabetes screenings. This checks your fasting blood sugar level. Have the screening done:  Once every three years after age 40 if you are at a normal weight and have a low risk for diabetes.  More often and at a younger age if you are overweight or have a high risk for diabetes. What should I know about preventing infection? Hepatitis B If you have a higher risk for hepatitis B, you should be screened for this virus. Talk with your health care provider to find out if you are at risk for hepatitis B infection. Hepatitis C Testing is recommended for:  Everyone born from 1945 through 1965.  Anyone with known risk factors for hepatitis C. Sexually transmitted infections (STIs)  Get screened for STIs, including gonorrhea and chlamydia, if: ? You are sexually active and are younger than 60 years of age. ? You are older than 60 years of age and your health care provider tells you that you are at risk for this type of infection. ? Your sexual activity has changed since you were last screened, and you are at increased risk for chlamydia or gonorrhea. Ask your health care provider if   you are at risk.  Ask your health care provider about whether you are at high risk for HIV. Your health care provider may recommend a prescription medicine to help prevent HIV infection. If you choose to take medicine to prevent HIV, you should first get tested for HIV. You should then be tested every 3 months for as long as you are taking the medicine. Pregnancy  If you are about to stop having your period (premenopausal) and  you may become pregnant, seek counseling before you get pregnant.  Take 400 to 800 micrograms (mcg) of folic acid every day if you become pregnant.  Ask for birth control (contraception) if you want to prevent pregnancy. Osteoporosis and menopause Osteoporosis is a disease in which the bones lose minerals and strength with aging. This can result in bone fractures. If you are 65 years old or older, or if you are at risk for osteoporosis and fractures, ask your health care provider if you should:  Be screened for bone loss.  Take a calcium or vitamin D supplement to lower your risk of fractures.  Be given hormone replacement therapy (HRT) to treat symptoms of menopause. Follow these instructions at home: Lifestyle  Do not use any products that contain nicotine or tobacco, such as cigarettes, e-cigarettes, and chewing tobacco. If you need help quitting, ask your health care provider.  Do not use street drugs.  Do not share needles.  Ask your health care provider for help if you need support or information about quitting drugs. Alcohol use  Do not drink alcohol if: ? Your health care provider tells you not to drink. ? You are pregnant, may be pregnant, or are planning to become pregnant.  If you drink alcohol: ? Limit how much you use to 0-1 drink a day. ? Limit intake if you are breastfeeding.  Be aware of how much alcohol is in your drink. In the [place] one drink equals one 12 oz bottle of beer (355 mL), one 5 oz glass of wine (148 mL), or one 1 oz glass of hard liquor (44 mL). General instructions  Schedule regular health, dental, and eye exams.  Stay current with your vaccines.  Tell your health care provider if: ? You often feel depressed. ? You have ever been abused or do not feel safe at home. Summary  Adopting a healthy lifestyle and getting preventive care are important in promoting health and wellness.  Follow your health care provider's instructions about healthy  diet, exercising, and getting tested or screened for diseases.  Follow your health care provider's instructions on monitoring your cholesterol and blood pressure. This information is not intended to replace advice given to you by your health care provider. Make sure you discuss any questions you have with your health care provider. Document Revised: 05/11/2018 Document Reviewed: 05/11/2018 Elsevier Patient Education  2020 Elsevier Inc.  

## 2019-06-28 NOTE — Progress Notes (Signed)
Subjective:     Briana Castillo is a 59 y.o. female and is here for a comprehensive physical exam. The patient reports no problems.  Social History   Socioeconomic History  . Marital status: Divorced    Spouse name: Not on file  . Number of children: 1  . Years of education: Not on file  . Highest education level: Not on file  Occupational History  . 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   Tobacco Use  . Smoking status: Current Some Day Smoker    Packs/day: 1.00    Types: Cigarettes  . Smokeless tobacco: Never Used  Substance and Sexual Activity  . Alcohol use: Yes    Alcohol/week: 8.0 standard drinks    Types: 8 Glasses of wine per week  . Drug use: No  . Sexual activity: Not on file    Comment: divorced, regular exercise.  Other Topics Concern  . Not on file  Social History Narrative   She drinks 2-3 caffeinated beverages daily. Regular exercise.   Social Determinants of Health   Financial Resource Strain:   . Difficulty of Paying Living Expenses: Not on file  Food Insecurity:   . Worried About Charity fundraiser in the Last Year: Not on file  . Ran Out of Food in the Last Year: Not on file  Transportation Needs:   . Lack of Transportation (Medical): Not on file  . Lack of Transportation (Non-Medical): Not on file  Physical Activity:   . Days of Exercise per Week: Not on file  . Minutes of Exercise per Session: Not on file  Stress:   . Feeling of Stress : Not on file  Social Connections:   . Frequency of Communication with Friends and Family: Not on file  . Frequency of Social Gatherings with Friends and Family: Not on file  . Attends Religious Services: Not on file  . Active Member of Clubs or Organizations: Not on file  . Attends Archivist Meetings: Not on file  . Marital Status: Not on file  Intimate Partner Violence:   . Fear of Current or Ex-Partner: Not on file  . Emotionally Abused: Not on file  . Physically Abused: Not  on file  . Sexually Abused: Not on file   Health Maintenance  Topic Date Due  . PAP SMEAR-Modifier  07/02/2019  . MAMMOGRAM  10/07/2019  . TETANUS/TDAP  11/12/2020  . COLONOSCOPY  01/30/2021  . INFLUENZA VACCINE  Completed  . Hepatitis C Screening  Completed  . HIV Screening  Completed    The following portions of the patient's history were reviewed and updated as appropriate: allergies, current medications, past family history, past medical history, past social history, past surgical history and problem list.  Review of Systems A comprehensive review of systems was negative.   Objective:    BP 128/84   Pulse 72   Ht 5\' 5"  (1.651 m)   Wt 202 lb (91.6 kg)   LMP 09/13/2011   SpO2 100%   BMI 33.61 kg/m  General appearance: alert, cooperative and appears stated age Head: Normocephalic, without obvious abnormality, atraumatic Eyes: conj clear, EOMI, PEERLA Ears: normal TM's and external ear canals both ears Nose: Nares normal. Septum midline. Mucosa normal. No drainage or sinus tenderness. Throat: lips, mucosa, and tongue normal; teeth and gums normal Neck: no adenopathy, no carotid bruit, no JVD, supple, symmetrical, trachea midline and thyroid not enlarged, symmetric, no tenderness/mass/nodules Back: symmetric, no curvature. ROM normal.  No CVA tenderness. Lungs: clear to auscultation bilaterally Breasts: normal appearance, no masses or tenderness Heart: regular rate and rhythm, S1, S2 normal, no murmur, click, rub or gallop Abdomen: soft, non-tender; bowel sounds normal; no masses,  no organomegaly Pelvic: cervix normal in appearance, external genitalia normal, no adnexal masses or tenderness, no cervical motion tenderness, rectovaginal septum normal, uterus normal size, shape, and consistency and vagina normal without discharge Extremities: extremities normal, atraumatic, no cyanosis or edema Pulses: 2+ and symmetric Skin: Skin color, texture, turgor normal. No rashes or  lesions Lymph nodes: Cervical, supraclavicular, and axillary nodes normal. Neurologic: Alert and oriented X 3, normal strength and tone. Normal symmetric reflexes. Normal coordination and gait    Assessment:    Healthy female exam.      Plan:     See After Visit Summary for Counseling Recommendations    Keep up a regular exercise program and make sure you are eating a healthy diet Try to eat 4 servings of dairy a day, or if you are lactose intolerant take a calcium with vitamin D daily.  Your vaccines are up to date.  Mammo scheduled for today.  Labs today.

## 2019-06-29 LAB — COMPLETE METABOLIC PANEL WITH GFR
AG Ratio: 2.2 (calc) (ref 1.0–2.5)
ALT: 16 U/L (ref 6–29)
AST: 18 U/L (ref 10–35)
Albumin: 4.3 g/dL (ref 3.6–5.1)
Alkaline phosphatase (APISO): 74 U/L (ref 37–153)
BUN: 14 mg/dL (ref 7–25)
CO2: 27 mmol/L (ref 20–32)
Calcium: 9.2 mg/dL (ref 8.6–10.4)
Chloride: 105 mmol/L (ref 98–110)
Creat: 0.84 mg/dL (ref 0.50–1.05)
GFR, Est African American: 88 mL/min/{1.73_m2} (ref >=60)
GFR, Est Non African American: 76 mL/min/{1.73_m2} (ref >=60)
Globulin: 2 g/dL (calc) (ref 1.9–3.7)
Glucose, Bld: 96 mg/dL (ref 65–99)
Potassium: 3.7 mmol/L (ref 3.5–5.3)
Sodium: 141 mmol/L (ref 135–146)
Total Bilirubin: 0.6 mg/dL (ref 0.2–1.2)
Total Protein: 6.3 g/dL (ref 6.1–8.1)

## 2019-06-29 LAB — CBC WITH DIFFERENTIAL/PLATELET
Absolute Monocytes: 436 cells/uL (ref 200–950)
Basophils Absolute: 52 cells/uL (ref 0–200)
Basophils Relative: 0.8 %
Eosinophils Absolute: 202 cells/uL (ref 15–500)
Eosinophils Relative: 3.1 %
HCT: 39.1 % (ref 35.0–45.0)
Hemoglobin: 13.1 g/dL (ref 11.7–15.5)
Lymphs Abs: 2061 cells/uL (ref 850–3900)
MCH: 31.7 pg (ref 27.0–33.0)
MCHC: 33.5 g/dL (ref 32.0–36.0)
MCV: 94.7 fL (ref 80.0–100.0)
MPV: 9.7 fL (ref 7.5–12.5)
Monocytes Relative: 6.7 %
Neutro Abs: 3751 cells/uL (ref 1500–7800)
Neutrophils Relative %: 57.7 %
Platelets: 234 10*3/uL (ref 140–400)
RBC: 4.13 10*6/uL (ref 3.80–5.10)
RDW: 12.9 % (ref 11.0–15.0)
Total Lymphocyte: 31.7 %
WBC: 6.5 10*3/uL (ref 3.8–10.8)

## 2019-06-29 LAB — LIPID PANEL W/REFLEX DIRECT LDL
Cholesterol: 258 mg/dL — ABNORMAL HIGH (ref <200)
HDL: 67 mg/dL (ref >=50)
LDL Cholesterol (Calc): 158 mg/dL (calc) — ABNORMAL HIGH
Non-HDL Cholesterol (Calc): 191 mg/dL (calc) — ABNORMAL HIGH (ref <130)
Total CHOL/HDL Ratio: 3.9 (calc) (ref <5.0)
Triglycerides: 173 mg/dL — ABNORMAL HIGH (ref <150)

## 2019-06-29 MED ORDER — EPINEPHRINE 0.3 MG/0.3ML IJ SOAJ: 0.3000 mg | Freq: Once | INTRAMUSCULAR | 0 refills | Status: AC

## 2019-06-29 MED ORDER — ATORVASTATIN CALCIUM 20 MG PO TABS: 20.0000 mg | ORAL_TABLET | Freq: Every day | ORAL | 3 refills | Status: DC

## 2019-06-29 NOTE — Addendum Note (Signed)
Addended by: Arvilla Market on: 06/29/2019 11:51 AM   Modules accepted: Orders

## 2019-06-30 ENCOUNTER — Other Ambulatory Visit: Payer: Self-pay | Admitting: *Deleted

## 2019-06-30 DIAGNOSIS — E782 Mixed hyperlipidemia: Secondary | ICD-10-CM

## 2019-06-30 LAB — CYTOLOGY - PAP
Comment: NEGATIVE
Diagnosis: NEGATIVE
High risk HPV: NEGATIVE

## 2019-06-30 MED ORDER — ATORVASTATIN CALCIUM 20 MG PO TABS: 20.0000 mg | ORAL_TABLET | Freq: Every day | ORAL | 3 refills | Status: DC

## 2019-06-30 NOTE — Progress Notes (Signed)
Call patient: Your Pap smear is normal. Repeat in 5 years.

## 2019-07-11 ENCOUNTER — Encounter: Payer: BLUE CROSS/BLUE SHIELD | Admitting: Family Medicine

## 2019-07-12 DIAGNOSIS — U071 COVID-19: Secondary | ICD-10-CM | POA: Diagnosis not present

## 2019-07-17 ENCOUNTER — Ambulatory Visit (INDEPENDENT_AMBULATORY_CARE_PROVIDER_SITE_OTHER): Payer: BC Managed Care – PPO | Admitting: Family Medicine

## 2019-07-17 ENCOUNTER — Other Ambulatory Visit: Payer: Self-pay

## 2019-07-17 ENCOUNTER — Encounter: Payer: Self-pay | Admitting: Family Medicine

## 2019-07-17 DIAGNOSIS — Z8616 Personal history of COVID-19: Secondary | ICD-10-CM | POA: Insufficient documentation

## 2019-07-17 DIAGNOSIS — U071 COVID-19: Secondary | ICD-10-CM | POA: Diagnosis not present

## 2019-07-17 NOTE — Assessment & Plan Note (Signed)
Recommend continued supportive care at home.  Symptoms improved today compared to the past couple of days She may continue tylenol/ibuprofen for aches/pain/fever.  Today is day 7 since onset of symptoms. She will continue quarantine for minimum of 10 days.  Discussed that she must also be fever free x24 hours and have improving symptoms.  We discussed red flags including increasing shortness of breath or dyspnea or worsening fatigue.   She expresses understanding.

## 2019-07-17 NOTE — Progress Notes (Signed)
Briana Castillo - 59 y.o. female MRN [id number]  Date of birth: March 23, 1961   This visit type was conducted due to national recommendations for restrictions regarding the COVID-19 Pandemic (e.g. social distancing).  This format is felt to be most appropriate for this patient at this time.  All issues noted in this document were discussed and addressed.  No physical exam was performed (except for noted visual exam findings with Video Visits).  I discussed the limitations of evaluation and management by telemedicine and the availability of in person appointments. The patient expressed understanding and agreed to proceed.  I connected with@ on 07/17/19 at 11:30 AM EST by a video enabled telemedicine application and verified that I am speaking with the correct person using two identifiers.  Present at visit: Everrett Coombe, DO Elsie Lincoln   Patient Location: Home [address] [place] [address]   Provider location:   [facility]  Chief Complaint  Patient presents with  . Generalized Body Aches    +Covid-19  . Cough    HPI  Briana Castillo is a 59 y.o. female who presents via audio/video conferencing for a telehealth visit today.  She reports positive COVID test that returned on 07/14/19.  Initial onset of symptoms was 07/10/19.  She reports that she has had fatigue, chills, body aches, nausea, mild cough and decreased appetite.  She quit smoking about 2 weeks ago.  She denies shortness of breath or chest tightness.  She report that symptoms have improved today compared to the past couple of days.  She has taken sudafed and is alternating ibuprofen and tylenol as needed or fever or aches.     ROS:  A comprehensive ROS was completed and negative except as noted per HPI  History reviewed. No pertinent past medical history.  Past Surgical History:  Procedure Laterality Date  . TUBAL LIGATION    . VARICOSE VEIN SURGERY     x 2    Family History  Problem Relation Age of Onset  . Diabetes Maternal  Grandmother   . Heart failure Father 18       Deceased    Social History   Socioeconomic History  . Marital status: Divorced    Spouse name: Not on file  . Number of children: 1  . Years of education: Not on file  . Highest education level: Not on file  Occupational History  . 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   Tobacco Use  . Smoking status: Former Smoker    Packs/day: 1.00    Years: 40.00    Pack years: 40.00    Types: Cigarettes    Quit date: 07/06/2019    Years since quitting: 0.0  . Smokeless tobacco: Never Used  Substance and Sexual Activity  . Alcohol use: Yes    Alcohol/week: 8.0 standard drinks    Types: 8 Glasses of wine per week  . Drug use: No  . Sexual activity: Not on file    Comment: divorced, regular exercise.  Other Topics Concern  . Not on file  Social History Narrative   She drinks 2-3 caffeinated beverages daily. Regular exercise.   Social Determinants of Health   Financial Resource Strain:   . Difficulty of Paying Living Expenses: Not on file  Food Insecurity:   . Worried About Programme researcher, broadcasting/film/video in the Last Year: Not on file  . Ran Out of Food in the Last Year: Not on file  Transportation Needs:   .  Lack of Transportation (Medical): Not on file  . Lack of Transportation (Non-Medical): Not on file  Physical Activity:   . Days of Exercise per Week: Not on file  . Minutes of Exercise per Session: Not on file  Stress:   . Feeling of Stress : Not on file  Social Connections:   . Frequency of Communication with Friends and Family: Not on file  . Frequency of Social Gatherings with Friends and Family: Not on file  . Attends Religious Services: Not on file  . Active Member of Clubs or Organizations: Not on file  . Attends Archivist Meetings: Not on file  . Marital Status: Not on file  Intimate Partner Violence:   . Fear of Current or Ex-Partner: Not on file  . Emotionally Abused: Not on file  . Physically  Abused: Not on file  . Sexually Abused: Not on file     Current Outpatient Medications:  .  ALPRAZolam (XANAX) 0.5 MG tablet, Take 1 tablet (0.5 mg total) by mouth daily as needed for anxiety., Disp: 45 tablet, Rfl: 1 .  Estradiol-Norethindrone Acet 0.5-0.1 MG tablet, TAKE 1 TABLET BY MOUTH EVERY DAY, Disp: 84 tablet, Rfl: 3 .  hydrochlorothiazide (HYDRODIURIL) 25 MG tablet, Take 1 tablet (25 mg total) by mouth daily., Disp: 90 tablet, Rfl: 1 .  atorvastatin (LIPITOR) 20 MG tablet, Take 1 tablet (20 mg total) by mouth at bedtime. (Patient not taking: Reported on 07/17/2019), Disp: 90 tablet, Rfl: 3 .  varenicline (CHANTIX) 1 MG tablet, Take 1 tablet (1 mg total) by mouth 2 (two) times daily. (Patient not taking: Reported on 07/17/2019), Disp: 60 tablet, Rfl: 2  EXAM:  VITALS per patient if applicable: BP 846/96   Pulse 74   Ht 5\' 7"  (1.702 m)   Wt 200 lb (90.7 kg)   LMP 09/13/2011   BMI 31.32 kg/m   GENERAL: alert, oriented, appears well and in no acute distress  HEENT: atraumatic, conjunttiva clear, no obvious abnormalities on inspection of external nose and ears  NECK: normal movements of the head and neck  LUNGS: on inspection no signs of respiratory distress, breathing rate appears normal, no obvious gross SOB, gasping or wheezing  CV: no obvious cyanosis  MS: moves all visible extremities without noticeable abnormality  PSYCH/NEURO: pleasant and cooperative, no obvious depression or anxiety, speech and thought processing grossly intact  ASSESSMENT AND PLAN:  Discussed the following assessment and plan:  COVID-19 Recommend continued supportive care at home.  Symptoms improved today compared to the past couple of days She may continue tylenol/ibuprofen for aches/pain/fever.  Today is day 7 since onset of symptoms. She will continue quarantine for minimum of 10 days.  Discussed that she must also be fever free x24 hours and have improving symptoms.  We discussed red flags  including increasing shortness of breath or dyspnea or worsening fatigue.   She expresses understanding.       I discussed the assessment and treatment plan with the patient. The patient was provided an opportunity to ask questions and all were answered. The patient agreed with the plan and demonstrated an understanding of the instructions.   The patient was advised to call back or seek an in-person evaluation if the symptoms worsen or if the condition fails to improve as anticipated.    Luetta Nutting, DO

## 2019-07-19 ENCOUNTER — Telehealth: Payer: Self-pay

## 2019-07-19 NOTE — Telephone Encounter (Signed)
Collie called and states she is still very fatigued and has chest aches and still coughing. She states her blood pressure is 95/60. She reports drinking 33-49 oz of water daily. She also hasn't felt like eating. Please advise.

## 2019-07-19 NOTE — Telephone Encounter (Signed)
Please have her hold the hctz for now and try to increase fluids.   Can we get her set up for the [facility] respiratory clinic as well?  Thanks!

## 2019-07-24 ENCOUNTER — Ambulatory Visit (INDEPENDENT_AMBULATORY_CARE_PROVIDER_SITE_OTHER): Payer: BC Managed Care – PPO | Admitting: Family Medicine

## 2019-07-24 ENCOUNTER — Encounter: Payer: Self-pay | Admitting: Family Medicine

## 2019-07-24 VITALS — BP 112/65 | Temp 97.6°F

## 2019-07-24 DIAGNOSIS — E782 Mixed hyperlipidemia: Secondary | ICD-10-CM | POA: Diagnosis not present

## 2019-07-24 DIAGNOSIS — I10 Essential (primary) hypertension: Secondary | ICD-10-CM | POA: Diagnosis not present

## 2019-07-24 DIAGNOSIS — U071 COVID-19: Secondary | ICD-10-CM | POA: Diagnosis not present

## 2019-07-24 NOTE — Assessment & Plan Note (Signed)
Blood pressures are running low probably because of rapid weight loss and decreased appetite with Covid.  So just encouraged her to continue to hold the hydrochlorothiazide I do not want her to get dehydrated and injure her kidneys.  Just encouraged her to watch her blood pressure if it starts going back up but when she is feeling better then okay to restart the medication but for now we will just continue to hold.

## 2019-07-24 NOTE — Progress Notes (Signed)
Virtual Visit via Video Note  I connected with Briana Castillo on 07/24/19 at  9:30 AM EST by a video enabled telemedicine application and verified that I am speaking with the correct person using two identifiers.   I discussed the limitations of evaluation and management by telemedicine and the availability of in person appointments. The patient expressed understanding and agreed to proceed.  Subjective:    CC: Fatigue   HPI:  Its been 2 weeks of not feeling well. She has been tested for COVID and this was positive. She had been more in the bed and just recently been able to get up and move around.  She says she is had a lot of muscle aches and soreness.  Just felt really tired.  Significantly decreased appetite.  She is mostly just been eating a little bit of so soup or toast here there.  She is also been trying to stay hydrated with Pedialyte water and Gatorade.  She has had a mild cough and says the first for 5 days of illness she had pretty significant chest pain to the point where she was felt like she was having a heart attack but says that has eased off since.  She also reports that the muscle aches and soreness started getting better over the weekend and yesterday she actually felt like eating a little bit.  She had a fever for one day. 101.3 nothing since. She has had chills/sweats.  She also reports that her blood pressures have been running low so she actually backed off of her blood pressure medication.  Normally takes 25 mg hydrochlorothiazide.   Past medical history, Surgical history, Family history not pertinant except as noted below, Social history, Allergies, and medications have been entered into the medical record, reviewed, and corrections made.   Review of Systems: No fevers, chills, night sweats, weight loss, chest pain, or shortness of breath.   Objective:    General: Speaking clearly in complete sentences without any shortness of breath.  Alert and oriented x3.  Normal  judgment. No apparent acute distress.    Impression and Recommendations:    HTN (hypertension) Blood pressures are running low probably because of rapid weight loss and decreased appetite with Covid.  So just encouraged her to continue to hold the hydrochlorothiazide I do not want her to get dehydrated and injure her kidneys.  Just encouraged her to watch her blood pressure if it starts going back up but when she is feeling better then okay to restart the medication but for now we will just continue to hold.  Hyperlipidemia She did let me know that she has not started the statin she read some things which is she picked up the medication it makes her little bit nervous about it.  We did not go into detail today so we are focused on the Covid.  COVID-19 Quite symptomatic 2 weeks into the illness.  We will give her a work note to extend out until Monday.  If she is not ready to return by then then please let us know and for some reason she is already to return sooner then please let us know continue to work on hydration and trying to eat.  Try to make sure that she is not laying around too much and she is getting up and trying to move around the house a little bit to stay active and to reduce muscle wasting and weakness.    Time spent in encounter 22 minutes  I discussed  the assessment and treatment plan with the patient. The patient was provided an opportunity to ask questions and all were answered. The patient agreed with the plan and demonstrated an understanding of the instructions.   The patient was advised to call back or seek an in-person evaluation if the symptoms worsen or if the condition fails to improve as anticipated.   Beatrice Lecher, MD

## 2019-07-24 NOTE — Progress Notes (Signed)
Its been 2 weeks of not feeling well. She has been tested for COVID and this was positive. She had been more in the bed and just recently been able to get up and move around.   She had a fever for one day. 101.3 nothing since. She has had chills/sweats.

## 2019-07-24 NOTE — Assessment & Plan Note (Signed)
Quite symptomatic 2 weeks into the illness.  We will give her a work note to extend out until Monday.  If she is not ready to return by then then please let us know and for some reason she is already to return sooner then please let us know continue to work on hydration and trying to eat.  Try to make sure that she is not laying around too much and she is getting up and trying to move around the house a little bit to stay active and to reduce muscle wasting and weakness.

## 2019-07-24 NOTE — Assessment & Plan Note (Signed)
She did let me know that she has not started the statin she read some things which is she picked up the medication it makes her little bit nervous about it.  We did not go into detail today so we are focused on the Covid.

## 2019-07-24 NOTE — Telephone Encounter (Signed)
Patient advised last week of recommendations.

## 2019-08-02 ENCOUNTER — Telehealth: Payer: Self-pay

## 2019-08-02 NOTE — Telephone Encounter (Signed)
[place] left a message for a return call about paperwork.

## 2019-08-03 NOTE — Telephone Encounter (Signed)
Called pt back and she stated that she will need to have forms filled out for STD. She said that her job does not pay for her being out of work for [employer].  Forms received and will fax back to [phone] once completed.Laureen Ochs, Viann Shove, CMA

## 2019-08-07 NOTE — Telephone Encounter (Signed)
Florina called for an update on paperwork.

## 2019-08-08 NOTE — Telephone Encounter (Signed)
lvm advising pt that the fax number that she gave keeps failing and to rtn call if there is another number to send her forms to.Laureen Ochs, Viann Shove, CMA'

## 2019-08-08 NOTE — Telephone Encounter (Signed)
Called and lvm letting pt know that the fax finally went thru. Laureen Ochs, Viann Shove, CMA Form completed,faxed,confirmation received and scanned into patient's chart.Laureen Ochs, Viann Shove, CMA

## 2019-12-20 ENCOUNTER — Other Ambulatory Visit: Payer: Self-pay | Admitting: Family Medicine

## 2019-12-20 DIAGNOSIS — I10 Essential (primary) hypertension: Secondary | ICD-10-CM

## 2019-12-27 ENCOUNTER — Other Ambulatory Visit: Payer: Self-pay | Admitting: Family Medicine

## 2019-12-28 NOTE — Telephone Encounter (Signed)
Last appt 07/24/2019  Last filled 06/28/2019 #45 with 1 refill

## 2020-04-18 ENCOUNTER — Encounter: Payer: Self-pay | Admitting: Family Medicine

## 2020-04-18 ENCOUNTER — Ambulatory Visit (INDEPENDENT_AMBULATORY_CARE_PROVIDER_SITE_OTHER): Payer: BC Managed Care – PPO | Admitting: Family Medicine

## 2020-04-18 ENCOUNTER — Other Ambulatory Visit: Payer: Self-pay

## 2020-04-18 VITALS — BP 136/88 | HR 69 | Ht 67.0 in | Wt 204.8 lb

## 2020-04-18 DIAGNOSIS — R011 Cardiac murmur, unspecified: Secondary | ICD-10-CM

## 2020-04-18 DIAGNOSIS — Z23 Encounter for immunization: Secondary | ICD-10-CM

## 2020-04-18 DIAGNOSIS — R5383 Other fatigue: Secondary | ICD-10-CM

## 2020-04-18 DIAGNOSIS — Z8616 Personal history of COVID-19: Secondary | ICD-10-CM

## 2020-04-18 DIAGNOSIS — Z79890 Hormone replacement therapy (postmenopausal): Secondary | ICD-10-CM

## 2020-04-18 DIAGNOSIS — R29898 Other symptoms and signs involving the musculoskeletal system: Secondary | ICD-10-CM

## 2020-04-18 DIAGNOSIS — I872 Venous insufficiency (chronic) (peripheral): Secondary | ICD-10-CM

## 2020-04-18 DIAGNOSIS — I10 Essential (primary) hypertension: Secondary | ICD-10-CM

## 2020-04-18 DIAGNOSIS — I83819 Varicose veins of unspecified lower extremities with pain: Secondary | ICD-10-CM

## 2020-04-18 MED ORDER — ESTRADIOL-NORETHINDRONE ACET 0.5-0.1 MG PO TABS: 1.0000 | ORAL_TABLET | Freq: Every day | ORAL | 1 refills | Status: DC

## 2020-04-18 NOTE — Assessment & Plan Note (Signed)
Some of her symptoms have worsened since having had Covid I do think this could be related to some more of the diffuse muscle aches and joint pain that she has been experiencing.

## 2020-04-18 NOTE — Assessment & Plan Note (Signed)
Would like to go ahead and get updated echocardiogram.  New order placed today we will get her scheduled.

## 2020-04-18 NOTE — Progress Notes (Signed)
Established Patient Office Visit  Subjective:  Patient ID: Briana Castillo, female    DOB: Apr 13, 1961  Age: 59 y.o. MRN: [id number]  CC:  Chief Complaint  Patient presents with  . Hypertension    HPI Briana LincolnDonna Castillo presents for a few concerns that Briana Castillo is noticed in particular since having had a very severe case of Covid back in February.  Briana Castillo says Briana Castillo was basically bedbound almost for 10 days with severe weakness and myalgias and chest pain and diaphoresis Briana Castillo never really experienced a lot of shortness of breath.  Briana Castillo does have a history of varicose veins that required treatment starting in her 30s and Briana Castillo just feels like her legs feel very tired, even just going up and down stairs.  Feels like Briana Castillo has to stop and rest.  Sometimes Briana Castillo says they will throb at night.  Briana Castillo feels like it is just in the muscle tissue not necessarily just the joints.  Also noticed that in her antecubital fossa area that it will feel very tight and sore when Briana Castillo first gets up in the morning and when Briana Castillo gets moving it feels better it does not feel like it is in the elbow joint per se.  Briana Castillo notices some intermittent heaviness in her chest.  But notices that it seems to be aggravated when Briana Castillo is feeling a little bit more constipated and then when Briana Castillo finally gets relief or has a bowel movement it actually seems to improve so Briana Castillo feels like they may be connected.  Heart murmur-we discussed getting an updated echocardiogram this summer but because of Covid ended up putting it off Briana Castillo would like to go ahead and move forward with that.  Last echocardiogram was in 2014 with an EF of 60 to 65%.  Grade 2 moderate diastolic dysfunction with mild mitral regurg, mild to moderate tricuspid regurg, and mild to moderate aortic regurg with mild aortic sclerosis.  He also wanted to discuss getting her flu vaccine, shingles vaccine and possibly a Covid booster but wanted to discuss when to do these.  No past medical history on  file.  Past Surgical History:  Procedure Laterality Date  . TUBAL LIGATION    . VARICOSE VEIN SURGERY     x 2    Family History  Problem Relation Age of Onset  . Diabetes Maternal Grandmother   . Heart failure Father [id number]       Deceased    Social History   Socioeconomic History  . Marital status: Divorced    Spouse name: Not on file  . Number of children: 1  . Years of education: Not on file  . Highest education level: Not on file  Occupational History  . Occupation: receptionist     Comment: [employer].   Tobacco Use  . Smoking status: Former Smoker    Packs/day: 1.00    Years: 40.00    Pack years: 40.00    Types: Cigarettes    Quit date: 07/06/2019    Years since quitting: 0.7  . Smokeless tobacco: Never Used  Substance and Sexual Activity  . Alcohol use: Yes    Alcohol/week: 8.0 standard drinks    Types: 8 Glasses of wine per week  . Drug use: No  . Sexual activity: Not on file    Comment: divorced, regular exercise.  Other Topics Concern  . Not on file  Social History Narrative   Briana Castillo drinks 2-3 caffeinated beverages daily. Regular exercise.   Social Determinants of Health  Financial Resource Strain:   . Difficulty of Paying Living Expenses: Not on file  Food Insecurity:   . Worried About Programme researcher, broadcasting/film/video in the Last Year: Not on file  . Ran Out of Food in the Last Year: Not on file  Transportation Needs:   . Lack of Transportation (Medical): Not on file  . Lack of Transportation (Non-Medical): Not on file  Physical Activity:   . Days of Exercise per Week: Not on file  . Minutes of Exercise per Session: Not on file  Stress:   . Feeling of Stress : Not on file  Social Connections:   . Frequency of Communication with Friends and Family: Not on file  . Frequency of Social Gatherings with Friends and Family: Not on file  . Attends Religious Services: Not on file  . Active Member of Clubs or Organizations: Not on file  . Attends Tax inspector Meetings: Not on file  . Marital Status: Not on file  Intimate Partner Violence:   . Fear of Current or Ex-Partner: Not on file  . Emotionally Abused: Not on file  . Physically Abused: Not on file  . Sexually Abused: Not on file    Outpatient Medications Prior to Visit  Medication Sig Dispense Refill  . ALPRAZolam (XANAX) 0.5 MG tablet TAKE 1 TABLET BY MOUTH EVERY DAY AS NEEDED FOR ANXIETY 45 tablet 0  . atorvastatin (LIPITOR) 20 MG tablet Take 1 tablet (20 mg total) by mouth at bedtime. 90 tablet 3  . EPINEPHrine 0.3 mg/0.3 mL IJ SOAJ injection INJECT 0.3 MLS (0.3 MG TOTAL) INTO THE MUSCLE ONCE FOR 1 DOSE.    . hydrochlorothiazide (HYDRODIURIL) 25 MG tablet TAKE 1 TABLET BY MOUTH EVERY DAY 90 tablet 1  . Estradiol-Norethindrone Acet 0.5-0.1 MG tablet TAKE 1 TABLET BY MOUTH EVERY DAY 84 tablet 3  . varenicline (CHANTIX) 1 MG tablet Take 1 tablet (1 mg total) by mouth 2 (two) times daily. 60 tablet 2   No facility-administered medications prior to visit.    Allergies  Allergen Reactions  . Amoxicillin Shortness Of Breath and Rash    REACTION: Swelling  . Celebrex [Celecoxib] Shortness Of Breath and Rash  . Penicillins Shortness Of Breath and Rash    ROS Review of Systems    Objective:    Physical Exam Constitutional:      Appearance: Briana Castillo is well-developed.  HENT:     Head: Normocephalic and atraumatic.  Cardiovascular:     Rate and Rhythm: Normal rate and regular rhythm.     Heart sounds: Normal heart sounds.     Comments: 3/6 SEM Pulmonary:     Effort: Pulmonary effort is normal.     Breath sounds: Normal breath sounds.  Skin:    General: Skin is warm and dry.     Comments: Has a lot of small varicose veins around the tops of her feet and ankles.  Neurological:     Mental Status: Briana Castillo is alert and oriented to person, place, and time.  Psychiatric:        Behavior: Behavior normal.     BP 136/88   Pulse 69   Ht 5\' 7"  (1.702 m)   Wt 204 lb 12.8 oz  (92.9 kg)   LMP 09/13/2011   SpO2 98%   BMI 32.08 kg/m  Wt Readings from Last 3 Encounters:  04/18/20 204 lb 12.8 oz (92.9 kg)  07/17/19 200 lb (90.7 kg)  06/28/19 202 lb (91.6 kg)  There are no preventive care reminders to display for this patient.  There are no preventive care reminders to display for this patient.  Lab Results  Component Value Date   TSH 3.70 07/01/2016   Lab Results  Component Value Date   WBC 6.5 06/28/2019   HGB 13.1 06/28/2019   HCT 39.1 06/28/2019   MCV 94.7 06/28/2019   PLT 234 06/28/2019   Lab Results  Component Value Date   NA 141 06/28/2019   K 3.7 06/28/2019   CO2 27 06/28/2019   GLUCOSE 96 06/28/2019   BUN 14 06/28/2019   CREATININE 0.84 06/28/2019   BILITOT 0.6 06/28/2019   ALKPHOS 79 07/01/2016   AST 18 06/28/2019   ALT 16 06/28/2019   PROT 6.3 06/28/2019   ALBUMIN 4.0 07/01/2016   CALCIUM 9.2 06/28/2019   Lab Results  Component Value Date   CHOL 258 (H) 06/28/2019   Lab Results  Component Value Date   HDL 67 06/28/2019   Lab Results  Component Value Date   LDLCALC 158 (H) 06/28/2019   Lab Results  Component Value Date   TRIG 173 (H) 06/28/2019   Lab Results  Component Value Date   CHOLHDL 3.9 06/28/2019   No results found for: HGBA1C    Assessment & Plan:   Problem List Items Addressed This Visit      Cardiovascular and Mediastinum   Venous insufficiency (chronic) (peripheral)    Think this is most likely culprit of her leg fatigue but since Briana Castillo is over 50 I think we should evaluate her for peripheral vascular disease with ABIs.  Order placed today      Varicose veins with pain    Encouraged her to wear custom fit compression stockings.  Briana Castillo says Briana Castillo knows the place and [facility] that sells them they have a great discounted prices there and encouraged her to go and get some stockings.      HTN (hypertension)    Pressure is little borderline elevated today at 136/88.  We will continue current regimen and  monitor.  Goal is for systolic to be less than 130.        Other   Hormone replacement therapy (HRT)    Now taking her HRT tab every other day and so far doing well on that.  Briana Castillo says Briana Castillo will likely need a refill soon.      Relevant Medications   Estradiol-Norethindrone Acet 0.5-0.1 MG tablet   History of COVID-19    Some of her symptoms have worsened since having had Covid I do think this could be related to some more of the diffuse muscle aches and joint pain that Briana Castillo has been experiencing.      Heart murmur    Would like to go ahead and get updated echocardiogram.  New order placed today we will get her scheduled.      Relevant Orders   ECHOCARDIOGRAM COMPLETE    Other Visit Diagnoses    Other fatigue    -  Primary   Relevant Orders   COMPLETE METABOLIC PANEL WITH GFR   CBC   TSH   Hemoglobin A1c   VAS [place] ABI WITH/WO TBI   Leg heaviness       Relevant Orders   COMPLETE METABOLIC PANEL WITH GFR   CBC   TSH   Hemoglobin A1c   VAS [place] ABI WITH/WO TBI   Need for immunization against influenza       Relevant Orders   Flu  Vaccine QUAD 36+ mos IM (Completed)      Given flu vaccine today.  We will plan to return in 2 to 3 weeks for nurse visit to get her first shingles vaccine.  We will consider Covid booster in January.  Meds ordered this encounter  Medications  . Estradiol-Norethindrone Acet 0.5-0.1 MG tablet    Sig: Take 1 tablet by mouth daily. Every Other Day    Dispense:  84 tablet    Refill:  1    Pt will call when needed.    Follow-up: Return if symptoms worsen or fail to improve.    Nani Gasser, MD

## 2020-04-18 NOTE — Assessment & Plan Note (Signed)
Encouraged her to wear custom fit compression stockings.  She says she knows the place and [facility] that sells them they have a great discounted prices there and encouraged her to go and get some stockings.

## 2020-04-18 NOTE — Assessment & Plan Note (Signed)
Pressure is little borderline elevated today at 136/88.  We will continue current regimen and monitor.  Goal is for systolic to be less than 130.

## 2020-04-18 NOTE — Assessment & Plan Note (Signed)
Now taking her HRT tab every other day and so far doing well on that.  She says she will likely need a refill soon.

## 2020-04-18 NOTE — Assessment & Plan Note (Signed)
Think this is most likely culprit of her leg fatigue but since she is over 49 I think we should evaluate her for peripheral vascular disease with ABIs.  Order placed today

## 2020-04-19 LAB — COMPLETE METABOLIC PANEL WITH GFR
AG Ratio: 1.7 (calc) (ref 1.0–2.5)
ALT: 21 U/L (ref 6–29)
AST: 23 U/L (ref 10–35)
Albumin: 4.2 g/dL (ref 3.6–5.1)
Alkaline phosphatase (APISO): 64 U/L (ref 37–153)
BUN: 13 mg/dL (ref 7–25)
CO2: 27 mmol/L (ref 20–32)
Calcium: 9.3 mg/dL (ref 8.6–10.4)
Chloride: 104 mmol/L (ref 98–110)
Creat: 0.89 mg/dL (ref 0.50–0.99)
GFR, Est African American: 82 mL/min/{1.73_m2} (ref >=60)
GFR, Est Non African American: 70 mL/min/{1.73_m2} (ref >=60)
Globulin: 2.5 g/dL (calc) (ref 1.9–3.7)
Glucose, Bld: 94 mg/dL (ref 65–99)
Potassium: 3.9 mmol/L (ref 3.5–5.3)
Sodium: 140 mmol/L (ref 135–146)
Total Bilirubin: 0.7 mg/dL (ref 0.2–1.2)
Total Protein: 6.7 g/dL (ref 6.1–8.1)

## 2020-04-19 LAB — HEMOGLOBIN A1C
Hgb A1c MFr Bld: 5.2 % of total Hgb (ref <5.7)
Mean Plasma Glucose: 103 (calc)
eAG (mmol/L): 5.7 (calc)

## 2020-04-19 LAB — CBC
HCT: 38.1 % (ref 35.0–45.0)
Hemoglobin: 12.7 g/dL (ref 11.7–15.5)
MCH: 31.8 pg (ref 27.0–33.0)
MCHC: 33.3 g/dL (ref 32.0–36.0)
MCV: 95.5 fL (ref 80.0–100.0)
MPV: 9.8 fL (ref 7.5–12.5)
Platelets: 207 10*3/uL (ref 140–400)
RBC: 3.99 10*6/uL (ref 3.80–5.10)
RDW: 12.7 % (ref 11.0–15.0)
WBC: 6.6 10*3/uL (ref 3.8–10.8)

## 2020-04-19 LAB — TSH: TSH: 3.05 mIU/L (ref 0.40–4.50)

## 2020-04-21 NOTE — Progress Notes (Signed)
All labs are normal. 

## 2020-05-09 ENCOUNTER — Telehealth: Payer: Self-pay

## 2020-05-09 NOTE — Telephone Encounter (Signed)
Briana Castillo called and states the Echo and the vascular ultrasound is going to cost too much. She is going to call around for better pricing. She will call back with name and location.

## 2020-05-15 ENCOUNTER — Ambulatory Visit (HOSPITAL_BASED_OUTPATIENT_CLINIC_OR_DEPARTMENT_OTHER): Payer: BC Managed Care – PPO

## 2020-06-26 ENCOUNTER — Other Ambulatory Visit: Payer: Self-pay | Admitting: Family Medicine

## 2020-06-26 DIAGNOSIS — I10 Essential (primary) hypertension: Secondary | ICD-10-CM

## 2020-06-26 NOTE — Telephone Encounter (Signed)
Please call pt and advise her to schedule an appointment. She can do her CPE her last one was 06/28/2019. She is due for fasting labs.   30 day supply of bp med sent to pharmacy.

## 2020-06-27 NOTE — Telephone Encounter (Signed)
Called patient and she states that she was seen in November of last year, said she has never had to be seen this soon after just being in November, she said she will come in if needed but does not see why. I told her probably to follow up on BP & continue with medication .. that BP is usually re checked every couple months but I will send a message to the nurse and see why for her. AM

## 2020-07-04 ENCOUNTER — Other Ambulatory Visit: Payer: Self-pay | Admitting: Family Medicine

## 2020-07-04 DIAGNOSIS — E782 Mixed hyperlipidemia: Secondary | ICD-10-CM

## 2020-07-04 DIAGNOSIS — Z0000 Encounter for general adult medical examination without abnormal findings: Secondary | ICD-10-CM

## 2020-07-04 MED ORDER — ATORVASTATIN CALCIUM 20 MG PO TABS: 20.0000 mg | ORAL_TABLET | Freq: Every day | ORAL | 0 refills | Status: DC

## 2020-07-04 NOTE — Telephone Encounter (Signed)
Ordered labs and refilled the Lipitor for 30 days.

## 2020-07-25 ENCOUNTER — Other Ambulatory Visit: Payer: Self-pay | Admitting: Family Medicine

## 2020-07-25 DIAGNOSIS — I10 Essential (primary) hypertension: Secondary | ICD-10-CM

## 2020-07-27 ENCOUNTER — Other Ambulatory Visit: Payer: Self-pay | Admitting: Family Medicine

## 2020-07-27 DIAGNOSIS — E782 Mixed hyperlipidemia: Secondary | ICD-10-CM

## 2020-08-07 ENCOUNTER — Other Ambulatory Visit: Payer: Self-pay | Admitting: Family Medicine

## 2020-08-07 DIAGNOSIS — Z1231 Encounter for screening mammogram for malignant neoplasm of breast: Secondary | ICD-10-CM

## 2020-08-15 ENCOUNTER — Encounter (HOSPITAL_BASED_OUTPATIENT_CLINIC_OR_DEPARTMENT_OTHER): Payer: Self-pay

## 2020-08-15 ENCOUNTER — Other Ambulatory Visit: Payer: Self-pay

## 2020-08-15 ENCOUNTER — Ambulatory Visit (HOSPITAL_BASED_OUTPATIENT_CLINIC_OR_DEPARTMENT_OTHER)
Admission: RE | Admit: 2020-08-15 | Discharge: 2020-08-15 | Disposition: A | Discharge status: Home / Self Care | Payer: BC Managed Care – PPO | Source: Ambulatory Visit | Attending: Family Medicine | Admitting: Family Medicine

## 2020-08-15 DIAGNOSIS — Z1231 Encounter for screening mammogram for malignant neoplasm of breast: Secondary | ICD-10-CM | POA: Diagnosis not present

## 2020-08-21 ENCOUNTER — Ambulatory Visit: Payer: BC Managed Care – PPO

## 2020-08-28 ENCOUNTER — Ambulatory Visit: Payer: BC Managed Care – PPO

## 2020-10-07 ENCOUNTER — Emergency Department
Admission: EM | Admit: 2020-10-07 | Discharge: 2020-10-07 | Disposition: A | Discharge status: Home / Self Care | Payer: BC Managed Care – PPO | Source: Home / Self Care

## 2020-10-07 ENCOUNTER — Encounter: Payer: Self-pay | Admitting: Emergency Medicine

## 2020-10-07 ENCOUNTER — Other Ambulatory Visit: Payer: Self-pay

## 2020-10-07 DIAGNOSIS — S61210A Laceration without foreign body of right index finger without damage to nail, initial encounter: Secondary | ICD-10-CM

## 2020-10-07 MED ORDER — SULFAMETHOXAZOLE-TRIMETHOPRIM 800-160 MG PO TABS: 1.0000 | ORAL_TABLET | Freq: Two times a day (BID) | ORAL | 0 refills | Status: AC

## 2020-10-07 NOTE — Discharge Instructions (Addendum)
Advised/instructed patient to take antibiotics with food to completion.  Encourage patient increase daily water intake while taking antibiotics.  Advised/encouraged patient keep wound area dry and clean for the next 5-7 days.  Encourage patient to keep dressing open to air is much as possible to allow wound area to heal by secondary intention.  Advised patient Steri-Strips will come off on their own.  Advised patient if signs or symptoms of infection evolve please RTC for further evaluation.

## 2020-10-07 NOTE — ED Provider Notes (Signed)
[facility]    CSN: [id number] Arrival date & time: 10/07/20  1509      History   Chief Complaint Chief Complaint  Patient presents with  . Laceration    HPI Briana Castillo is a 60 y.o. female.   HPI-year-old female presents with laceration of right index finger that occurred at 10 PM last night.  Patient reports while she was placing flowers in a vase, vase accidentally cracked cutting her right index finger.  Patient reports cleaning right index finger wound with soap and water, applied Neosporin, and Band-Aid to stop bleeding.  History reviewed. No pertinent past medical history.  Patient Active Problem List   Diagnosis Date Noted  . History of COVID-19 07/17/2019  . Aortic valve sclerosis 10/06/2017  . Hormone replacement therapy (HRT) 07/01/2016  . Hyperlipidemia 04/22/2015  . Left knee pain 03/05/2015  . Trochanteric bursitis 03/05/2015  . Edema, peripheral 03/05/2015  . 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 HTN (hypertension) 12/25/2014  . Spider veins 04/18/2013  . Venous insufficiency (chronic) (peripheral) 12/08/2011  . Varicose veins with pain 08/11/2011  . Heart murmur 11/13/2010  . Anxiety 10/17/2010  . CORNEAL ABRASION, LEFT 08/27/2008    Past Surgical History:  Procedure Laterality Date  . TUBAL LIGATION    . VARICOSE VEIN SURGERY     x 2    OB History   No obstetric history on file.      Home Medications    Prior to Admission medications   Medication Sig Start Date End Date Taking? Authorizing Provider  ALPRAZolam Prudy Feeler) 0.5 MG tablet TAKE 1 TABLET BY MOUTH EVERY DAY AS NEEDED FOR ANXIETY 01/01/20  Yes Agapito Games, MD  atorvastatin (LIPITOR) 20 MG tablet TAKE 1 TABLET BY MOUTH EVERYDAY AT BEDTIME 07/29/20  Yes Agapito Games, MD  Cholecalciferol (D3) 50 MCG (2000 UT) TABS Take 1 tablet by mouth daily.   Yes [provider]  EPINEPHrine 0.3 mg/0.3 mL IJ SOAJ injection INJECT 0.3 MLS (0.3 MG TOTAL) INTO THE MUSCLE ONCE  FOR 1 DOSE. 06/30/19  Yes [provider]  Estradiol-Norethindrone Acet 0.5-0.1 MG tablet Take 1 tablet by mouth daily. Every Other Day 04/18/20  Yes Agapito Games, MD  hydrochlorothiazide (HYDRODIURIL) 25 MG tablet TAKE 1 TABLET BY MOUTH EVERY DAY 07/25/20  Yes Agapito Games, MD  sulfamethoxazole-trimethoprim (BACTRIM DS) 800-160 MG tablet Take 1 tablet by mouth 2 (two) times daily for 5 days. 10/07/20 10/12/20 Yes Trevor Iha, FNP    Family History Family History  Problem Relation Age of Onset  . Diabetes Maternal Grandmother   . Heart failure Father 67       Deceased    Social History Social History   Tobacco Use  . Smoking status: Former Smoker    Packs/day: 1.00    Years: 40.00    Pack years: 40.00    Types: Cigarettes    Quit date: 07/06/2019    Years since quitting: 1.2  . Smokeless tobacco: Never Used  Substance Use Topics  . Alcohol use: Yes    Alcohol/week: 8.0 standard drinks    Types: 8 Glasses of wine per week  . Drug use: No     Allergies   Amoxicillin, Celebrex [celecoxib], and Penicillins   Review of Systems Review of Systems  Constitutional: Negative.   HENT: Negative.   Eyes: Negative.   Respiratory: Negative.   Cardiovascular: Negative.   Gastrointestinal: Negative.   Genitourinary: Negative.   Musculoskeletal: Negative.  Skin: Positive for wound.  Neurological: Negative.      Physical Exam Triage Vital Signs ED Triage Vitals  Enc Vitals Group     BP 10/07/20 1557 (!) 150/108     Pulse Rate 10/07/20 1557 69     Resp --      Temp 10/07/20 1557 98.3 F (36.8 C)     Temp Source 10/07/20 1557 Oral     SpO2 10/07/20 1557 97 %     Weight --      Height --      Head Circumference --      Peak Flow --      Pain Score 10/07/20 1600 2     Pain Loc --      Pain Edu? --      Excl. in GC? --    No data found.  Updated Vital Signs BP (!) 150/108 (BP Location: Left Arm)   Pulse 69   Temp 98.3 F (36.8 C) (Oral)    LMP 09/13/2011   SpO2 97%      Physical Exam Constitutional:      General: She is not in acute distress.    Appearance: Normal appearance. She is not ill-appearing.  HENT:     Head: Normocephalic and atraumatic.  Eyes:     Extraocular Movements: Extraocular movements intact.     Pupils: Pupils are equal, round, and reactive to light.  Cardiovascular:     Rate and Rhythm: Normal rate and regular rhythm.     Pulses: Normal pulses.     Heart sounds: Normal heart sounds.  Pulmonary:     Effort: Pulmonary effort is normal.     Breath sounds: Normal breath sounds. No wheezing, rhonchi or rales.  Skin:    General: Skin is warm and dry.     Comments: Right Index Finger: ~1.5 cm x 0.5 cm L-shaped avulsion flap laceration, area inspected, cleansed with normal saline flush, no foreign body, noted, homeostasis achieved. #3 1/4 in steri strips applied with skin edges everted, Dermabond placed over Steri-Strips in line with laceration wound edges, then nonadherent Telfa pad, followed by nonrestrictive Coban bandage applied.  Neurological:     General: No focal deficit present.     Mental Status: She is alert and oriented to person, place, and time.  Psychiatric:        Mood and Affect: Mood normal.        Behavior: Behavior normal.      UC Treatments / Results  Labs (all labs ordered are listed, but only abnormal results are displayed) Labs Reviewed - No data to display  EKG   Radiology No results found.  Procedures Procedures (including critical care time)  Medications Ordered in UC Medications - No data to display  Initial Impression / Assessment and Plan / UC Course  I have reviewed the triage vital signs and the nursing notes.  Pertinent labs & imaging results that were available during my care of the patient were reviewed by me and considered in my medical decision making (see chart for details).     MDM: 1.  Laceration of right index finger without foreign body,  without nail damage, initial encounter.  Patient discharged home hemodynamically stable Final Clinical Impressions(s) / UC Diagnoses   Final diagnoses:  Laceration of right index finger without foreign body without damage to nail, initial encounter     Discharge Instructions     Advised/instructed patient to take antibiotics with food to completion.  Encourage  patient increase daily water intake while taking antibiotics.  Advised/encouraged patient keep wound area dry and clean for the next 5-7 days.  Encourage patient to keep dressing open to air is much as possible to allow wound area to heal by secondary intention.  Advised patient Steri-Strips will come off on their own.  Advised patient if signs or symptoms of infection evolve please RTC for further evaluation.    ED Prescriptions    Medication Sig Dispense Auth. Provider   sulfamethoxazole-trimethoprim (BACTRIM DS) 800-160 MG tablet Take 1 tablet by mouth 2 (two) times daily for 5 days. 10 tablet Trevor Iha, FNP     PDMP not reviewed this encounter.   Trevor Iha, FNP 10/07/20 [phone]

## 2020-10-07 NOTE — ED Triage Notes (Signed)
Patient states that she was placing flowers in a vase last night and the vase cracked.  Patient cut right index finger.  Cleaned well, applied neosporin and bandaid.  Tdap about 4-5 years ago.

## 2020-10-21 ENCOUNTER — Other Ambulatory Visit: Payer: Self-pay | Admitting: Family Medicine

## 2020-10-21 DIAGNOSIS — I10 Essential (primary) hypertension: Secondary | ICD-10-CM

## 2021-01-18 ENCOUNTER — Other Ambulatory Visit: Payer: Self-pay | Admitting: Family Medicine

## 2021-01-18 DIAGNOSIS — I10 Essential (primary) hypertension: Secondary | ICD-10-CM

## 2021-02-16 ENCOUNTER — Other Ambulatory Visit: Payer: Self-pay | Admitting: Family Medicine

## 2021-02-16 DIAGNOSIS — I10 Essential (primary) hypertension: Secondary | ICD-10-CM

## 2021-03-19 ENCOUNTER — Other Ambulatory Visit: Payer: Self-pay | Admitting: *Deleted

## 2021-03-19 DIAGNOSIS — I10 Essential (primary) hypertension: Secondary | ICD-10-CM

## 2021-03-19 MED ORDER — HYDROCHLOROTHIAZIDE 25 MG PO TABS: 25.0000 mg | ORAL_TABLET | Freq: Every day | ORAL | 0 refills | Status: DC

## 2021-03-25 ENCOUNTER — Other Ambulatory Visit: Payer: Self-pay | Admitting: *Deleted

## 2021-03-25 DIAGNOSIS — R5383 Other fatigue: Secondary | ICD-10-CM

## 2021-03-25 DIAGNOSIS — R011 Cardiac murmur, unspecified: Secondary | ICD-10-CM

## 2021-03-25 DIAGNOSIS — Z0000 Encounter for general adult medical examination without abnormal findings: Secondary | ICD-10-CM

## 2021-03-25 DIAGNOSIS — R29898 Other symptoms and signs involving the musculoskeletal system: Secondary | ICD-10-CM

## 2021-04-11 ENCOUNTER — Other Ambulatory Visit: Payer: Self-pay | Admitting: Family Medicine

## 2021-04-11 DIAGNOSIS — I10 Essential (primary) hypertension: Secondary | ICD-10-CM

## 2021-04-15 ENCOUNTER — Encounter: Payer: Self-pay | Admitting: Family Medicine

## 2021-04-15 ENCOUNTER — Other Ambulatory Visit: Payer: Self-pay | Admitting: Family Medicine

## 2021-04-15 ENCOUNTER — Ambulatory Visit (INDEPENDENT_AMBULATORY_CARE_PROVIDER_SITE_OTHER): Payer: BC Managed Care – PPO | Admitting: Family Medicine

## 2021-04-15 VITALS — BP 130/64 | HR 83 | Ht 67.0 in | Wt 202.0 lb

## 2021-04-15 DIAGNOSIS — R1013 Epigastric pain: Secondary | ICD-10-CM | POA: Diagnosis not present

## 2021-04-15 DIAGNOSIS — R011 Cardiac murmur, unspecified: Secondary | ICD-10-CM

## 2021-04-15 DIAGNOSIS — Z0000 Encounter for general adult medical examination without abnormal findings: Secondary | ICD-10-CM | POA: Diagnosis not present

## 2021-04-15 DIAGNOSIS — Z79890 Hormone replacement therapy (postmenopausal): Secondary | ICD-10-CM

## 2021-04-15 DIAGNOSIS — Z1211 Encounter for screening for malignant neoplasm of colon: Secondary | ICD-10-CM

## 2021-04-15 DIAGNOSIS — E039 Hypothyroidism, unspecified: Secondary | ICD-10-CM | POA: Diagnosis not present

## 2021-04-15 DIAGNOSIS — Z23 Encounter for immunization: Secondary | ICD-10-CM | POA: Diagnosis not present

## 2021-04-15 MED ORDER — ALPRAZOLAM 0.5 MG PO TABS: ORAL_TABLET | ORAL | 0 refills | Status: DC

## 2021-04-15 MED ORDER — EPINEPHRINE 0.3 MG/0.3ML IJ SOAJ: INTRAMUSCULAR | 99 refills | Status: DC

## 2021-04-15 MED ORDER — ESTRADIOL-NORETHINDRONE ACET 0.5-0.1 MG PO TABS: 1.0000 | ORAL_TABLET | ORAL | 3 refills | Status: DC

## 2021-04-15 NOTE — Progress Notes (Signed)
Pt is fasting today, labs were ordered for her on 10/25. She stated that she had weaned off of the Estradiol about 1 month ago but her hot flashes returned and would like a refill of this.

## 2021-04-15 NOTE — Progress Notes (Addendum)
Subjective:     Briana Castillo is a 60 y.o. female and is here for a comprehensive physical exam. The patient reports problems - a few concerns today .  She knows she is due for colon Ca screening. She also says for the last months she had been having occ epigastric pain after eating and feeling bloated. Occ has problems with her bowels.    She did look back through her records and was seen by Cardiology on 01/2013 in [place].  She has been trying to check on cost of echo, etc so she can get those scheduled.    Taking her HRT every other day.  She has been doing well on them.    Taking supplements. Brought in a list and wanted to make sure OK/safe to take.  Wants to know if should stop her ASA.    Has a few skin lesion she would like me to look at.    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   Tobacco Use   Smoking status: Former    Packs/day: 1.00    Years: 40.00    Pack years: 40.00    Types: Cigarettes    Quit date: 07/06/2019    Years since quitting: 1.7   Smokeless tobacco: Never  Substance and Sexual Activity   Alcohol use: Yes    Alcohol/week: 8.0 standard drinks    Types: 8 Glasses of wine per week   Drug use: No   Sexual activity: Not on file    Comment: divorced, regular exercise.  Other Topics Concern   Not on file  Social History Narrative   She drinks 2-3 caffeinated beverages daily. Regular exercise.   Social Determinants of Health   Financial Resource Strain: Not on file  Food Insecurity: Not on file  Transportation Needs: Not on file  Physical Activity: Not on file  Stress: Not on file  Social Connections: Not on file  Intimate Partner Violence: Not on file   Health Maintenance  Topic Date Due   COLONOSCOPY (Pts 45-38yrs  Insurance coverage will need to be confirmed)  01/30/2021   Zoster Vaccines- Shingrix (1 of 2) 07/16/2022 (Originally 10/10/2009)   COVID-19 Vaccine (4 - Booster for Pfizer series) 04/16/2023 (Originally 08/30/2020)   MAMMOGRAM  08/16/2022   PAP SMEAR-Modifier  06/27/2024   TETANUS/TDAP  04/16/2031   INFLUENZA VACCINE  Completed   Hepatitis C Screening  Completed   HIV Screening  Completed   Pneumococcal Vaccine 73-44 Years old  Aged Out   HPV VACCINES  Aged Out    The following portions of the patient's history were reviewed and updated as appropriate: allergies, current medications, past family history, past medical history, past social history, past surgical history, and problem list.  Review of Systems A comprehensive review of systems was negative.   Objective:    BP 130/64   Pulse 83   Ht 5\' 7"  (1.702 m)   Wt 202 lb (91.6 kg)   LMP 09/13/2011   SpO2 100%   BMI 31.64 kg/m  General appearance: alert, cooperative, and appears stated age Head: Normocephalic, without obvious abnormality, atraumatic Eyes:  conj clear, EOMi, PEERLA Ears: normal TM's and external ear canals both ears Nose: Nares  normal. Septum midline. Mucosa normal. No drainage or sinus tenderness. Throat: lips, mucosa, and tongue normal; teeth and gums normal Neck: no adenopathy, no carotid bruit, no JVD, supple, symmetrical, trachea midline, and thyroid not enlarged, symmetric, no tenderness/mass/nodules Back: symmetric, no curvature. ROM normal. No CVA tenderness. Lungs: clear to auscultation bilaterally Breasts: normal appearance, no masses or tenderness Heart:  5/6 SEM Abdomen: soft, non-tender; bowel sounds normal; no masses,  no organomegaly Extremities: extremities normal, atraumatic, no cyanosis or edema Pulses: 2+ and symmetric Skin:  few scatted seborrheic keratoses and cherry angiomas Lymph nodes: Cervical adenopathy: nl, Axillary adenopathy: nl, and Supraclavicular adenopathy: nl Neurologic:  Grossly normal    Assessment:    Healthy female exam.      Plan:     See After Visit Summary for Counseling Recommendations  Keep up a regular exercise program and make sure you are eating a healthy diet Try to eat 4 servings of dairy a day, or if you are lactose intolerant take a calcium with vitamin D daily.  Your vaccines are up to date.  Pap up to dates, due 2026. .  Problem List Items Addressed This Visit       Other   Hormone replacement therapy (HRT)    Discussed restarting HRT but taking every 3rd day instead of every other day.  New rx sent.       Relevant Medications   Estradiol-Norethindrone Acet 0.5-0.1 MG tablet   Heart murmur    Checking on cost at different locations. She will be back in touch about this.       Other Visit Diagnoses     Screening for colon cancer    -  Primary   Relevant Orders   Ambulatory referral to Gastroenterology   Need for Tdap vaccination       Relevant Orders   Tdap vaccine greater than or equal to 7yo IM (Completed)   Epigastric pain       Relevant Orders   Ambulatory referral to Gastroenterology

## 2021-04-15 NOTE — Assessment & Plan Note (Signed)
Checking on cost at different locations. She will be back in touch about this.

## 2021-04-15 NOTE — Progress Notes (Deleted)
Established Patient Office Visit  Subjective:  Patient ID: Briana Castillo, female    DOB: 19-Jun-1960  Age: 60 y.o. MRN: [id number]  CC: No chief complaint on file.   HPI Briana Castillo presents for ***  No past medical history on file.  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   Tobacco Use   Smoking status: Former    Packs/day: 1.00    Years: 40.00    Pack years: 40.00    Types: Cigarettes    Quit date: 07/06/2019    Years since quitting: 1.7   Smokeless tobacco: Never  Substance and Sexual Activity   Alcohol use: Yes    Alcohol/week: 8.0 standard drinks    Types: 8 Glasses of wine per week   Drug use: No   Sexual activity: Not on file    Comment: divorced, regular exercise.  Other Topics Concern   Not on file  Social History Narrative   She drinks 2-3 caffeinated beverages daily. Regular exercise.   Social Determinants of Health   Financial Resource Strain: Not on file  Food Insecurity: Not on file  Transportation Needs: Not on file  Physical Activity: Not on file  Stress: Not on file  Social Connections: Not on file  Intimate Partner Violence: Not on file    Outpatient Medications Prior to Visit  Medication Sig Dispense Refill   ALPRAZolam (XANAX) 0.5 MG tablet TAKE 1 TABLET BY MOUTH EVERY DAY AS NEEDED FOR ANXIETY 45 tablet 0   atorvastatin (LIPITOR) 20 MG tablet TAKE 1 TABLET BY MOUTH EVERYDAY AT BEDTIME 90 tablet 3   Cholecalciferol (D3) 50 MCG (2000 UT) TABS Take 1 tablet by mouth daily.      EPINEPHrine 0.3 mg/0.3 mL IJ SOAJ injection INJECT 0.3 MLS (0.3 MG TOTAL) INTO THE MUSCLE ONCE FOR 1 DOSE.     Estradiol-Norethindrone Acet 0.5-0.1 MG tablet Take 1 tablet by mouth daily. Every Other Day 84 tablet 1   hydrochlorothiazide (HYDRODIURIL) 25 MG tablet Take 1 tablet (25 mg total) by mouth daily. 90 tablet 1   No facility-administered medications prior to visit.    Allergies  Allergen Reactions   Amoxicillin Shortness Of Breath and Rash    REACTION: Swelling   Celebrex [Celecoxib] Shortness Of Breath and Rash   Penicillins Shortness Of Breath and Rash    ROS Review of Systems    Objective:    Physical Exam  LMP 09/13/2011  Wt Readings from Last 3 Encounters:  04/18/20 204 lb 12.8 oz (92.9 kg)  07/17/19 200 lb (90.7 kg)  06/28/19 202 lb (91.6 kg)     Health Maintenance Due  Topic Date Due   Zoster Vaccines- Shingrix (1 of 2) Never done   COVID-19 Vaccine (4 - Booster for Pfizer series) 08/30/2020   TETANUS/TDAP  11/12/2020   COLONOSCOPY (Pts 45-48yrs Insurance coverage will need to be confirmed)  01/30/2021  There are no preventive care reminders to display for this patient.  Lab Results  Component Value Date   TSH 3.05 04/18/2020   Lab Results  Component Value Date   WBC 6.6 04/18/2020   HGB 12.7 04/18/2020   HCT 38.1 04/18/2020   MCV 95.5 04/18/2020   PLT 207 04/18/2020   Lab Results  Component Value Date   NA 140 04/18/2020   K 3.9 04/18/2020   CO2 27 04/18/2020   GLUCOSE 94 04/18/2020   BUN 13 04/18/2020   CREATININE 0.89 04/18/2020   BILITOT 0.7 04/18/2020   ALKPHOS 79 07/01/2016   AST 23 04/18/2020   ALT 21 04/18/2020   PROT 6.7 04/18/2020   ALBUMIN 4.0 07/01/2016   CALCIUM 9.3 04/18/2020   Lab Results  Component Value Date   CHOL 258 (H) 06/28/2019   Lab Results  Component Value Date   HDL 67 06/28/2019   Lab Results  Component Value Date   LDLCALC 158 (H) 06/28/2019   Lab Results  Component Value Date   TRIG 173  (H) 06/28/2019   Lab Results  Component Value Date   CHOLHDL 3.9 06/28/2019   Lab Results  Component Value Date   HGBA1C 5.2 04/18/2020      Assessment & Plan:   Problem List Items Addressed This Visit   None   No orders of the defined types were placed in this encounter.   Follow-up: No follow-ups on file.    Nani Gasser, MD

## 2021-04-15 NOTE — Assessment & Plan Note (Signed)
Discussed restarting HRT but taking every 3rd day instead of every other day.  New rx sent.

## 2021-04-16 ENCOUNTER — Encounter: Payer: Self-pay | Admitting: Family Medicine

## 2021-04-16 ENCOUNTER — Other Ambulatory Visit: Payer: Self-pay | Admitting: Family Medicine

## 2021-04-16 ENCOUNTER — Other Ambulatory Visit: Payer: Self-pay

## 2021-04-16 DIAGNOSIS — R17 Unspecified jaundice: Secondary | ICD-10-CM

## 2021-04-16 DIAGNOSIS — Z79890 Hormone replacement therapy (postmenopausal): Secondary | ICD-10-CM

## 2021-04-16 NOTE — Progress Notes (Signed)
Ordered labs per result note.  °

## 2021-04-16 NOTE — Telephone Encounter (Signed)
Task completed. Pharmacy has been updated of provider's note.

## 2021-04-16 NOTE — Telephone Encounter (Signed)
This is correct.  I am decreasing the frequency of her dosing from every other day to every third day.

## 2021-04-16 NOTE — Progress Notes (Signed)
Hi Briana Castillo, your bilirubin was slightly elevated not in a worrisome range but normally your baseline is a little bit lower than this so I do want to keep an eye on that and maybe plan to recheck it again in a month.  Liver function look good and kidney function was normal.  Cholesterol looks much better.  In fact her LDL is under 100 which is fantastic.  Great work!  Continue with your atorvastatin.  Blood count is normal.  TSH is just a little borderline similar to when it jumped up about 6 years ago.  We will call the lab and see if we can add a free T4 to your labs.

## 2021-04-18 LAB — COMPLETE METABOLIC PANEL WITH GFR
AG Ratio: 1.9 (calc) (ref 1.0–2.5)
ALT: 20 U/L (ref 6–29)
AST: 25 U/L (ref 10–35)
Albumin: 4.7 g/dL (ref 3.6–5.1)
Alkaline phosphatase (APISO): 82 U/L (ref 37–153)
BUN: 13 mg/dL (ref 7–25)
CO2: 28 mmol/L (ref 20–32)
Calcium: 9.5 mg/dL (ref 8.6–10.4)
Chloride: 102 mmol/L (ref 98–110)
Creat: 0.89 mg/dL (ref 0.50–1.05)
Globulin: 2.5 g/dL (calc) (ref 1.9–3.7)
Glucose, Bld: 92 mg/dL (ref 65–99)
Potassium: 3.5 mmol/L (ref 3.5–5.3)
Sodium: 139 mmol/L (ref 135–146)
Total Bilirubin: 1.3 mg/dL — ABNORMAL HIGH (ref 0.2–1.2)
Total Protein: 7.2 g/dL (ref 6.1–8.1)
eGFR: 74 mL/min/{1.73_m2} (ref >=60)

## 2021-04-18 LAB — CBC WITH DIFFERENTIAL/PLATELET
Absolute Monocytes: 433 cells/uL (ref 200–950)
Basophils Absolute: 53 cells/uL (ref 0–200)
Basophils Relative: 0.7 %
Eosinophils Absolute: 190 cells/uL (ref 15–500)
Eosinophils Relative: 2.5 %
HCT: 39.8 % (ref 35.0–45.0)
Hemoglobin: 13.3 g/dL (ref 11.7–15.5)
Lymphs Abs: 2751 cells/uL (ref 850–3900)
MCH: 31.8 pg (ref 27.0–33.0)
MCHC: 33.4 g/dL (ref 32.0–36.0)
MCV: 95.2 fL (ref 80.0–100.0)
MPV: 9.9 fL (ref 7.5–12.5)
Monocytes Relative: 5.7 %
Neutro Abs: 4172 cells/uL (ref 1500–7800)
Neutrophils Relative %: 54.9 %
Platelets: 229 10*3/uL (ref 140–400)
RBC: 4.18 10*6/uL (ref 3.80–5.10)
RDW: 12.7 % (ref 11.0–15.0)
Total Lymphocyte: 36.2 %
WBC: 7.6 10*3/uL (ref 3.8–10.8)

## 2021-04-18 LAB — LIPID PANEL W/REFLEX DIRECT LDL
Cholesterol: 175 mg/dL (ref <200)
HDL: 75 mg/dL (ref >=50)
LDL Cholesterol (Calc): 72 mg/dL (calc)
Non-HDL Cholesterol (Calc): 100 mg/dL (calc) (ref <130)
Total CHOL/HDL Ratio: 2.3 (calc) (ref <5.0)
Triglycerides: 180 mg/dL — ABNORMAL HIGH (ref <150)

## 2021-04-18 LAB — TSH: TSH: 5.55 mIU/L — ABNORMAL HIGH (ref 0.40–4.50)

## 2021-04-18 LAB — T4, FREE: Free T4: 1.2 ng/dL (ref 0.8–1.8)

## 2021-04-18 NOTE — Progress Notes (Signed)
Additional thyroid test looks great.  So plan to just recheck your TSH and free T4 in 6 months.

## 2021-04-18 NOTE — Telephone Encounter (Signed)
Okay to reorder.  But to get them approved we will have to pick a location.  Some still not clear where exactly she wants to have them performed.  But may be added refer nothing to the note.

## 2021-04-18 NOTE — Progress Notes (Signed)
Pt would like to see Dr. Gwendolyn Lima on [facility] in [place].

## 2021-04-21 ENCOUNTER — Encounter: Payer: BC Managed Care – PPO | Admitting: Family Medicine

## 2021-06-10 DIAGNOSIS — R29898 Other symptoms and signs involving the musculoskeletal system: Secondary | ICD-10-CM | POA: Diagnosis not present

## 2021-06-10 DIAGNOSIS — R011 Cardiac murmur, unspecified: Secondary | ICD-10-CM | POA: Diagnosis not present

## 2021-06-10 DIAGNOSIS — R5383 Other fatigue: Secondary | ICD-10-CM | POA: Diagnosis not present

## 2021-06-18 ENCOUNTER — Telehealth: Payer: Self-pay

## 2021-06-18 DIAGNOSIS — I351 Nonrheumatic aortic (valve) insufficiency: Secondary | ICD-10-CM | POA: Insufficient documentation

## 2021-06-18 DIAGNOSIS — I080 Rheumatic disorders of both mitral and aortic valves: Secondary | ICD-10-CM | POA: Insufficient documentation

## 2021-06-18 NOTE — Telephone Encounter (Signed)
Hi Briana Castillo,   Your Echo of your heart looks good overall. The pumping function is normal.  You do  have some moderate back flow on your aortic , tricuspid, and mitral valves. You had an echo in 2014 but I can't see the actual report for comparison. For the aortic valve we just recommend repeat echo if you because short of breath with activity or lightheaded.  For the mitral valve we monitor more closely usually recommending and echo every 1-2 years. Because you have been having some fatigue, I think I would like for you to see Cardiology to look for any secondary causes and see if they feel you might need further workup.  The test on your leg to evaluate for peripheral vascular disease, is normal.  Great news!!    Please let me know if you are oK with seeing Cards and if you have a preference for provider or location.    Dr. Judie Petit

## 2021-06-18 NOTE — Telephone Encounter (Signed)
Patient called requesting results of her ABI test done at [facility]. Forward to Dr. Linford Arnold.   Impressions  06/10/2021 4:33 PM EST   Procedure: Examination consists of physiologic resting arterial  pressures of the brachial and ankle arteries bilaterally with continuous  wave Doppler waveform analysis.   Indication: History of leg heaviness.   Previous: no previous exams for comparison.  Conclusion:   Normal ABI's Bilaterally.      Imaging Results - VAS-ABI's w/Ankle Waveforms Bilateral (06/10/2021 2:22 PM EST) Narrative  06/10/2021 4:33 PM EST   This result has an attachment that is not available.    Right ABI Right: ABI is 1.20. By continuous wave Doppler, triphasic waveforms are seen at the right posterior tibial artery at rest. By continuous wave Doppler, biphasic waveforms are seen at the right anterior tibial artery at rest.  Left ABI Left: ABI is 1.11. By continuous wave Doppler, triphasic waveforms are seen at the left anterior tibial artery and posterior tibial artery

## 2021-06-18 NOTE — Telephone Encounter (Signed)
Patient called requesting for results for Cardio US done at [facility].   Impressions  06/10/2021 3:22 PM EST   Left Ventricle: EF: 60-65%.   Aortic Valve: There is mild sclerosis.   Aortic Valve: Moderate aortic valve regurgitation.   Mitral Valve: There is moderate regurgitation.   Tricuspid Valve: The right ventricular systolic pressure is normal (<36  mmHg).   Tricuspid Valve: There is mild to moderate regurgitation.      Imaging Results - Echocardiogram Complete WO Enhancing Agent (06/10/2021 2:59 PM EST) Narrative  06/10/2021 3:22 PM EST   This result has an attachment that is not available.    Left Ventricle Left ventricle size is normal. There is mild hypertrophy. EF: 60-65%. Wall motion is normal. Doppler parameters indicate normal diastolic function.  Right Ventricle Right ventricle is normal. Systolic function is normal.  Left Atrium Left atrium is mildly dilated.  Right Atrium Normal sized right atrium.  Mitral Valve Mitral valve structure is normal. There is moderate regurgitation.  Tricuspid Valve Tricuspid valve structure is normal. There is mild to moderate regurgitation. The right ventricular systolic pressure is normal (<36 mmHg).  Aortic Valve The aortic valve was not well visualized. There is mild sclerosis. Moderate aortic valve regurgitation. There is no evidence of aortic valve stenosis.  Pulmonic Valve The pulmonic valve was not well visualized. No regurgitation present on the pulmonic valve  Ascending Aorta The aortic root is normal in size.  Pericardium There is no pericardial effusion.  Study Details A complete echo was performed using complete 2D, color flow Doppler and spectral Doppler.

## 2021-06-20 ENCOUNTER — Telehealth: Payer: Self-pay

## 2021-06-20 DIAGNOSIS — I351 Nonrheumatic aortic (valve) insufficiency: Secondary | ICD-10-CM

## 2021-06-20 NOTE — Telephone Encounter (Signed)
Ref placed.  Okay to go ahead and proceed with colonoscopy.  Orders Placed This Encounter  Procedures   Ambulatory referral to Cardiology    Referral Priority:   Routine    Referral Type:   Consultation    Referral Reason:   Specialty Services Required    Requested Specialty:   Cardiology    Number of Visits Requested:   1

## 2021-06-20 NOTE — Telephone Encounter (Signed)
Reviewed message/results with patient of echo. Patient stated she does have fatigue off and on. Patient would like to proceed with referral to Cardiologist. Patient requesting for [facility] in [place]. Referral pended. Patient also would like to know if she should post pone her colonoscopy in a week util after she sees the Cardiologist? Forward to Dr. Linford Arnold.

## 2021-06-23 NOTE — Telephone Encounter (Signed)
Patient advised of recommendations.  

## 2021-07-02 ENCOUNTER — Telehealth: Payer: Self-pay | Admitting: Family Medicine

## 2021-07-02 DIAGNOSIS — K219 Gastro-esophageal reflux disease without esophagitis: Secondary | ICD-10-CM | POA: Diagnosis not present

## 2021-07-02 DIAGNOSIS — Z1211 Encounter for screening for malignant neoplasm of colon: Secondary | ICD-10-CM | POA: Diagnosis not present

## 2021-07-02 NOTE — Telephone Encounter (Signed)
Call pt: ABIs of legs are normal.  No sign of peripheral  vascular disease.

## 2021-07-03 NOTE — Telephone Encounter (Signed)
Patient advised of results and recommendations.  

## 2021-07-08 DIAGNOSIS — E781 Pure hyperglyceridemia: Secondary | ICD-10-CM | POA: Diagnosis not present

## 2021-07-08 DIAGNOSIS — I080 Rheumatic disorders of both mitral and aortic valves: Secondary | ICD-10-CM | POA: Diagnosis not present

## 2021-07-08 DIAGNOSIS — I10 Essential (primary) hypertension: Secondary | ICD-10-CM | POA: Diagnosis not present

## 2021-07-08 DIAGNOSIS — R0609 Other forms of dyspnea: Secondary | ICD-10-CM | POA: Diagnosis not present

## 2021-07-22 ENCOUNTER — Other Ambulatory Visit: Payer: Self-pay | Admitting: Family Medicine

## 2021-07-22 DIAGNOSIS — E782 Mixed hyperlipidemia: Secondary | ICD-10-CM

## 2021-08-16 ENCOUNTER — Other Ambulatory Visit: Payer: Self-pay | Admitting: Family Medicine

## 2021-08-16 DIAGNOSIS — E782 Mixed hyperlipidemia: Secondary | ICD-10-CM

## 2021-08-21 DIAGNOSIS — R0609 Other forms of dyspnea: Secondary | ICD-10-CM | POA: Diagnosis not present

## 2021-10-06 ENCOUNTER — Other Ambulatory Visit: Payer: Self-pay | Admitting: Family Medicine

## 2021-10-06 DIAGNOSIS — I10 Essential (primary) hypertension: Secondary | ICD-10-CM

## 2021-10-29 ENCOUNTER — Other Ambulatory Visit (HOSPITAL_BASED_OUTPATIENT_CLINIC_OR_DEPARTMENT_OTHER): Payer: Self-pay | Admitting: Family Medicine

## 2021-10-29 DIAGNOSIS — Z1231 Encounter for screening mammogram for malignant neoplasm of breast: Secondary | ICD-10-CM

## 2021-10-30 ENCOUNTER — Ambulatory Visit (INDEPENDENT_AMBULATORY_CARE_PROVIDER_SITE_OTHER): Payer: BC Managed Care – PPO

## 2021-10-30 DIAGNOSIS — Z1231 Encounter for screening mammogram for malignant neoplasm of breast: Secondary | ICD-10-CM

## 2021-11-03 NOTE — Progress Notes (Signed)
Please call patient. Normal mammogram.  Repeat in 1 year.  

## 2021-11-16 ENCOUNTER — Other Ambulatory Visit: Payer: Self-pay | Admitting: Family Medicine

## 2021-11-16 DIAGNOSIS — E782 Mixed hyperlipidemia: Secondary | ICD-10-CM

## 2022-01-01 ENCOUNTER — Other Ambulatory Visit: Payer: Self-pay | Admitting: Family Medicine

## 2022-01-01 DIAGNOSIS — I10 Essential (primary) hypertension: Secondary | ICD-10-CM

## 2022-01-02 NOTE — Telephone Encounter (Signed)
Patient declined scheduling an office visit, she stated she has been on the medication for 5 years and has never had to schedule an office visit, that they normally just have her come in for a blood pressure check every so often. AMUCK

## 2022-01-02 NOTE — Telephone Encounter (Signed)
Pls contact pt to schedule appt for HTN. Sending 45 day refill due to scheduling availability.  Past due since 10/13/21. Thx

## 2022-02-08 ENCOUNTER — Other Ambulatory Visit: Payer: Self-pay | Admitting: Family Medicine

## 2022-02-08 DIAGNOSIS — I10 Essential (primary) hypertension: Secondary | ICD-10-CM

## 2022-02-17 ENCOUNTER — Other Ambulatory Visit: Payer: Self-pay | Admitting: Family Medicine

## 2022-02-17 DIAGNOSIS — E782 Mixed hyperlipidemia: Secondary | ICD-10-CM

## 2022-02-18 NOTE — Telephone Encounter (Signed)
Please call pt and inform her that she will need a f/u and fasting labs for refills on her medications.

## 2022-03-08 ENCOUNTER — Other Ambulatory Visit: Payer: Self-pay | Admitting: Family Medicine

## 2022-03-08 DIAGNOSIS — I10 Essential (primary) hypertension: Secondary | ICD-10-CM

## 2022-03-12 ENCOUNTER — Other Ambulatory Visit: Payer: Self-pay | Admitting: Family Medicine

## 2022-03-12 DIAGNOSIS — I10 Essential (primary) hypertension: Secondary | ICD-10-CM

## 2022-03-14 ENCOUNTER — Other Ambulatory Visit: Payer: Self-pay | Admitting: Family Medicine

## 2022-03-14 DIAGNOSIS — E782 Mixed hyperlipidemia: Secondary | ICD-10-CM

## 2022-03-24 ENCOUNTER — Other Ambulatory Visit: Payer: Self-pay | Admitting: Family Medicine

## 2022-03-24 DIAGNOSIS — I10 Essential (primary) hypertension: Secondary | ICD-10-CM

## 2022-04-08 ENCOUNTER — Other Ambulatory Visit: Payer: Self-pay | Admitting: Family Medicine

## 2022-04-08 DIAGNOSIS — E782 Mixed hyperlipidemia: Secondary | ICD-10-CM

## 2022-04-12 ENCOUNTER — Other Ambulatory Visit: Payer: Self-pay | Admitting: Family Medicine

## 2022-04-12 DIAGNOSIS — E782 Mixed hyperlipidemia: Secondary | ICD-10-CM

## 2022-04-15 ENCOUNTER — Telehealth: Payer: Self-pay | Admitting: Family Medicine

## 2022-04-15 DIAGNOSIS — E782 Mixed hyperlipidemia: Secondary | ICD-10-CM

## 2022-04-15 DIAGNOSIS — I10 Essential (primary) hypertension: Secondary | ICD-10-CM

## 2022-04-15 MED ORDER — HYDROCHLOROTHIAZIDE 25 MG PO TABS: 25.0000 mg | ORAL_TABLET | Freq: Every day | ORAL | 0 refills | Status: DC

## 2022-04-15 MED ORDER — ATORVASTATIN CALCIUM 20 MG PO TABS: ORAL_TABLET | ORAL | 0 refills | Status: DC

## 2022-04-15 NOTE — Telephone Encounter (Signed)
Patient called she is scheduled but needs her (HYDRODIURIL) 25 MG tablet  refills she only has 5 pills left.

## 2022-04-15 NOTE — Telephone Encounter (Signed)
Last refills were sent back in October. Pt would have already ran out of medications by now.   Will send over 30 day supply

## 2022-04-15 NOTE — Addendum Note (Signed)
Addended by: Deno Etienne on: 04/15/2022 03:07 PM   Modules accepted: Orders

## 2022-05-08 ENCOUNTER — Other Ambulatory Visit: Payer: Self-pay | Admitting: Family Medicine

## 2022-05-08 DIAGNOSIS — E782 Mixed hyperlipidemia: Secondary | ICD-10-CM

## 2022-05-08 DIAGNOSIS — I10 Essential (primary) hypertension: Secondary | ICD-10-CM

## 2022-05-19 NOTE — Progress Notes (Signed)
Complete physical exam  Patient: Briana Castillo   DOB: 1960-09-05   61 y.o. Female  MRN: [id number]  Subjective:    Chief Complaint  Patient presents with   Follow-up    Briana Castillo is a 61 y.o. female who presents today for a complete physical exam. She reports consuming a general diet. The patient does not participate in regular exercise at present. She generally feels fairly well. . She does have additional problems to discuss today.  She just been really stressful year and dealing with her beloved cat who passed away, and also dealing with her mom who has her own health concerns as well.   Most recent fall risk assessment:    04/15/2021    2:35 PM  Fall Risk   Falls in the past year? 0  Number falls in past yr: 0  Injury with Fall? 0  Risk for fall due to : No Fall Risks  Follow up Falls prevention discussed;Falls evaluation completed     Most recent depression screenings:    04/15/2021    2:34 PM 06/28/2019    8:34 AM  PHQ 2/9 Scores  PHQ - 2 Score 1 1      Past Surgical History:  Procedure Laterality Date   TUBAL LIGATION     VARICOSE VEIN SURGERY     x 2   Social History   Tobacco Use   Smoking status: Former    Packs/day: 1.00    Years: 40.00    Total pack years: 40.00    Types: Cigarettes    Quit date: 07/06/2019    Years since quitting: 2.8   Smokeless tobacco: Never  Substance Use Topics   Alcohol use: Yes    Alcohol/week: 8.0 standard drinks of alcohol    Types: 8 Glasses of wine per week   Drug use: No      Patient Care Team: Agapito Games, MD as PCP - General (Family Medicine)   Outpatient Medications Prior to Visit  Medication Sig   atorvastatin (LIPITOR) 20 MG tablet TAKE 1 TABLET BY MOUTH EVERYDAY AT BEDTIME. LAST REFILL. NEEDS LABS/APPT.   EPINEPHrine 0.3 mg/0.3 mL IJ SOAJ injection INJECT 0.3 MLS (0.3 MG TOTAL) INTO THE MUSCLE ONCE FOR 1 DOSE.   loratadine (CLARITIN) 10 MG tablet Take 10 mg by mouth daily.   Multiple  Vitamins-Minerals (CENTRUM SILVER 50+WOMEN PO) Take by mouth.   [DISCONTINUED] ALPRAZolam (XANAX) 0.5 MG tablet TAKE 1 TABLET BY MOUTH EVERY DAY AS NEEDED FOR ANXIETY   [DISCONTINUED] hydrochlorothiazide (HYDRODIURIL) 25 MG tablet TAKE 1 TABLET (25 MG TOTAL) BY MOUTH DAILY.   [DISCONTINUED] Biotin 86578 MCG TABS Take by mouth. (Patient not taking: Reported on 05/20/2022)   [DISCONTINUED] Cholecalciferol (D3) 50 MCG (2000 UT) TABS Take 1 tablet by mouth daily. (Patient not taking: Reported on 05/20/2022)   [DISCONTINUED] Cholecalciferol (VITAMIN D3) 50 MCG (2000 UT) TABS Take by mouth. (Patient not taking: Reported on 05/20/2022)   [DISCONTINUED] Estradiol-Norethindrone Acet 0.5-0.1 MG tablet TAKE 1 TABLET BY MOUTH EVERY 3 (THREE) DAYS. EVERY OTHER DAY (Patient not taking: Reported on 05/20/2022)   [DISCONTINUED] Magnesium 250 MG TABS Take 1 tablet by mouth daily. (Patient not taking: Reported on 05/20/2022)   [DISCONTINUED] Omega-3 Fatty Acids (FISH OIL) 1200 MG CPDR Take 1 capsule by mouth daily. Nature Made Fish Oil Minis (Patient not taking: Reported on 05/20/2022)   [DISCONTINUED] Pumpkin Seed-Soy Germ (AZO BLADDER CONTROL/GO-LESS PO) Take by mouth. (Patient not taking: Reported on 05/20/2022)   [  DISCONTINUED] SUPER B COMPLEX/C PO Take 1 tablet by mouth daily. Nature [employer] (Patient not taking: Reported on 05/20/2022)   [DISCONTINUED] TURMERIC PO Take by mouth daily. [facility] (Patient not taking: Reported on 05/20/2022)   No facility-administered medications prior to visit.    ROS        Objective:     BP 120/70   Pulse 70   Ht 5\' 7"  (1.702 m)   Wt 195 lb (88.5 kg)   LMP 09/13/2011   SpO2 99%   BMI 30.54 kg/m     Physical Exam Vitals and nursing note reviewed.  Constitutional:      Appearance: She is well-developed.  HENT:     Head: Normocephalic and atraumatic.     Right Ear: External ear normal.     Left Ear: External ear normal.     Nose: Nose normal.  Eyes:      Conjunctiva/sclera: Conjunctivae normal.     Pupils: Pupils are equal, round, and reactive to light.  Neck:     Thyroid: No thyromegaly.  Cardiovascular:     Rate and Rhythm: Normal rate and regular rhythm.     Heart sounds: Normal heart sounds.  Pulmonary:     Effort: Pulmonary effort is normal.     Breath sounds: Normal breath sounds. No wheezing.  Musculoskeletal:     Cervical back: Neck supple.  Lymphadenopathy:     Cervical: No cervical adenopathy.  Skin:    General: Skin is warm and dry.     Coloration: Skin is not pale.  Neurological:     Mental Status: She is alert and oriented to person, place, and time.  Psychiatric:        Behavior: Behavior normal.      Results for orders placed or performed in visit on 05/20/22  HM COLONOSCOPY  Result Value Ref Range   HM Colonoscopy See Report (in chart) See Report (in chart), Patient Reported        Assessment & Plan:    Routine Health Maintenance and Physical Exam  Immunization History  Administered Date(s) Administered   Influenza,inj,Quad PF,6+ Mos 04/14/2018, 04/25/2019, 04/18/2020, 03/29/2021, 05/20/2022   Moderna Sars-Covid-2 Vaccination 07/05/2020   PFIZER(Purple Top)SARS-COV-2 Vaccination 10/20/2019, 11/10/2019   Pneumococcal Polysaccharide-23 01/13/2012   Tdap 11/13/2010, 04/15/2021   Zoster, Live 04/18/2015    Health Maintenance  Topic Date Due   Lung Cancer Screening  Never done   COVID-19 Vaccine (4 - 2023-24 season) 06/05/2022 (Originally 01/30/2022)   Zoster Vaccines- Shingrix (1 of 2) 07/16/2022 (Originally 10/10/2009)   COLONOSCOPY (Pts 45-79yrs Insurance coverage will need to be confirmed)  05/21/2023 (Originally 02/19/2021)   MAMMOGRAM  10/31/2023   PAP SMEAR-Modifier  06/27/2024   DTaP/Tdap/Td (3 - Td or Tdap) 04/16/2031   INFLUENZA VACCINE  Completed   Hepatitis C Screening  Completed   HIV Screening  Completed   Pneumococcal Vaccine 22-79 Years old  Aged Out   HPV VACCINES  Aged Out     Discussed health benefits of physical activity, and encouraged her to engage in regular exercise appropriate for her age and condition.  Problem List Items Addressed This Visit   None Visit Diagnoses     Wellness examination    -  Primary   Relevant Orders   Lipid Panel w/reflex Direct LDL   COMPLETE METABOLIC PANEL WITH GFR   CBC   Essential hypertension       Relevant Medications   hydrochlorothiazide (HYDRODIURIL) 25 MG tablet   Other  Relevant Orders   Lipid Panel w/reflex Direct LDL   COMPLETE METABOLIC PANEL WITH GFR   CBC   Former smoker       Relevant Orders   Ambulatory Referral Lung Cancer Screening [facility]     Keep up a regular exercise program and make sure you are eating a healthy diet Try to eat 4 servings of dairy a day, or if you are lactose intolerant take a calcium with vitamin D daily.  Your vaccines are up to date.   Former smoker-did discuss lung cancer screening as well.  Plans to reschedule her colonoscopy and endoscopy.  She had actually scheduled last year but then ended up getting a cardiac workup and it got delayed and did not have a chance to get it rescheduled so encouraged her to give them a call.  We are always happy to put in a new referral if she needs 1.  Return in about 6 months (around 11/19/2022) for Hypertension.     Nani Gasser, MD

## 2022-05-20 ENCOUNTER — Encounter: Payer: Self-pay | Admitting: Family Medicine

## 2022-05-20 ENCOUNTER — Ambulatory Visit (INDEPENDENT_AMBULATORY_CARE_PROVIDER_SITE_OTHER): Payer: BC Managed Care – PPO | Admitting: Family Medicine

## 2022-05-20 VITALS — BP 120/70 | HR 70 | Ht 67.0 in | Wt 195.0 lb

## 2022-05-20 DIAGNOSIS — Z23 Encounter for immunization: Secondary | ICD-10-CM | POA: Diagnosis not present

## 2022-05-20 DIAGNOSIS — Z0000 Encounter for general adult medical examination without abnormal findings: Secondary | ICD-10-CM | POA: Diagnosis not present

## 2022-05-20 DIAGNOSIS — I10 Essential (primary) hypertension: Secondary | ICD-10-CM | POA: Diagnosis not present

## 2022-05-20 DIAGNOSIS — Z87891 Personal history of nicotine dependence: Secondary | ICD-10-CM

## 2022-05-20 MED ORDER — ALPRAZOLAM 0.5 MG PO TABS: ORAL_TABLET | ORAL | 0 refills | Status: DC

## 2022-05-20 MED ORDER — HYDROCHLOROTHIAZIDE 25 MG PO TABS: 25.0000 mg | ORAL_TABLET | Freq: Every day | ORAL | 3 refills | Status: DC

## 2022-05-21 LAB — LIPID PANEL W/REFLEX DIRECT LDL
Cholesterol: 175 mg/dL (ref <200)
HDL: 87 mg/dL (ref >=50)
LDL Cholesterol (Calc): 68 mg/dL (calc)
Non-HDL Cholesterol (Calc): 88 mg/dL (calc) (ref <130)
Total CHOL/HDL Ratio: 2 (calc) (ref <5.0)
Triglycerides: 110 mg/dL (ref <150)

## 2022-05-21 LAB — COMPLETE METABOLIC PANEL WITH GFR
AG Ratio: 2 (calc) (ref 1.0–2.5)
ALT: 22 U/L (ref 6–29)
AST: 29 U/L (ref 10–35)
Albumin: 4.5 g/dL (ref 3.6–5.1)
Alkaline phosphatase (APISO): 75 U/L (ref 37–153)
BUN: 14 mg/dL (ref 7–25)
CO2: 29 mmol/L (ref 20–32)
Calcium: 9.2 mg/dL (ref 8.6–10.4)
Chloride: 104 mmol/L (ref 98–110)
Creat: 0.87 mg/dL (ref 0.50–1.05)
Globulin: 2.3 g/dL (calc) (ref 1.9–3.7)
Glucose, Bld: 99 mg/dL (ref 65–99)
Potassium: 3.4 mmol/L — ABNORMAL LOW (ref 3.5–5.3)
Sodium: 142 mmol/L (ref 135–146)
Total Bilirubin: 1.2 mg/dL (ref 0.2–1.2)
Total Protein: 6.8 g/dL (ref 6.1–8.1)
eGFR: 75 mL/min/{1.73_m2} (ref >=60)

## 2022-05-21 LAB — CBC
HCT: 38.7 % (ref 35.0–45.0)
Hemoglobin: 13 g/dL (ref 11.7–15.5)
MCH: 32.1 pg (ref 27.0–33.0)
MCHC: 33.6 g/dL (ref 32.0–36.0)
MCV: 95.6 fL (ref 80.0–100.0)
MPV: 9.9 fL (ref 7.5–12.5)
Platelets: 204 10*3/uL (ref 140–400)
RBC: 4.05 10*6/uL (ref 3.80–5.10)
RDW: 12.6 % (ref 11.0–15.0)
WBC: 6.1 10*3/uL (ref 3.8–10.8)

## 2022-05-21 NOTE — Progress Notes (Signed)
Hi Briana Castillo, kidney function is stable.  Potassium was just borderline low.  Normally it is in the normal range so would like to recheck this in about 2 or 3 weeks.  Liver enzymes are normal.  Cholesterol and blood count look great this year.

## 2022-06-09 ENCOUNTER — Other Ambulatory Visit: Payer: Self-pay | Admitting: *Deleted

## 2022-06-09 DIAGNOSIS — E876 Hypokalemia: Secondary | ICD-10-CM

## 2022-06-18 DIAGNOSIS — E876 Hypokalemia: Secondary | ICD-10-CM | POA: Diagnosis not present

## 2022-06-19 LAB — POTASSIUM: Potassium: 4 mmol/L (ref 3.5–5.3)

## 2022-06-19 NOTE — Progress Notes (Signed)
Your repeat potassium was great!!

## 2022-06-25 DIAGNOSIS — R197 Diarrhea, unspecified: Secondary | ICD-10-CM | POA: Diagnosis not present

## 2022-06-25 DIAGNOSIS — Z1211 Encounter for screening for malignant neoplasm of colon: Secondary | ICD-10-CM | POA: Diagnosis not present

## 2022-06-25 DIAGNOSIS — K219 Gastro-esophageal reflux disease without esophagitis: Secondary | ICD-10-CM | POA: Diagnosis not present

## 2022-06-25 DIAGNOSIS — R1012 Left upper quadrant pain: Secondary | ICD-10-CM | POA: Diagnosis not present

## 2022-07-08 ENCOUNTER — Telehealth: Payer: BC Managed Care – PPO | Admitting: Physician Assistant

## 2022-07-08 ENCOUNTER — Telehealth: Payer: Self-pay | Admitting: *Deleted

## 2022-07-08 DIAGNOSIS — U071 COVID-19: Secondary | ICD-10-CM | POA: Diagnosis not present

## 2022-07-08 MED ORDER — NIRMATRELVIR/RITONAVIR (PAXLOVID)TABLET: 3.0000 | ORAL_TABLET | Freq: Two times a day (BID) | ORAL | 0 refills | Status: AC

## 2022-07-08 MED ORDER — FLUTICASONE PROPIONATE 50 MCG/ACT NA SUSP: 2.0000 | Freq: Every day | NASAL | 0 refills | Status: DC

## 2022-07-08 MED ORDER — EPINEPHRINE 0.3 MG/0.3ML IJ SOAJ: INTRAMUSCULAR | 99 refills | Status: DC

## 2022-07-08 NOTE — Telephone Encounter (Signed)
Pt called stating that she tested Positive for Covid this morning.   She asked if she could get Plaxovid sent to her pharmacy.

## 2022-07-08 NOTE — Patient Instructions (Signed)
Briana Castillo, thank you for joining Leeanne Rio, PA-C for today's virtual visit.  While this provider is not your primary care provider (PCP), if your PCP is located in our provider database this encounter information will be shared with them immediately following your visit.   [facility] account gives you access to today's visit and all your visits, tests, and labs performed at [facility] " click here if you don't have a [facility] account or go to [email or web address]  Consent: (Patient) Briana Castillo provided verbal consent for this virtual visit at the beginning of the encounter.  Current Medications:  Current Outpatient Medications:    ALPRAZolam (XANAX) 0.5 MG tablet, TAKE 1 TABLET BY MOUTH EVERY DAY AS NEEDED FOR ANXIETY, Disp: 45 tablet, Rfl: 0   atorvastatin (LIPITOR) 20 MG tablet, TAKE 1 TABLET BY MOUTH EVERYDAY AT BEDTIME. LAST REFILL. NEEDS LABS/APPT., Disp: 90 tablet, Rfl: 0   EPINEPHrine 0.3 mg/0.3 mL IJ SOAJ injection, INJECT 0.3 MLS (0.3 MG TOTAL) INTO THE MUSCLE ONCE FOR 1 DOSE., Disp: 1 each, Rfl: PRN   hydrochlorothiazide (HYDRODIURIL) 25 MG tablet, Take 1 tablet (25 mg total) by mouth daily., Disp: 90 tablet, Rfl: 3   loratadine (CLARITIN) 10 MG tablet, Take 10 mg by mouth daily., Disp: , Rfl:    Multiple Vitamins-Minerals (CENTRUM SILVER 50+WOMEN PO), Take by mouth., Disp: , Rfl:    Medications ordered in this encounter:  No orders of the defined types were placed in this encounter.    *If you need refills on other medications prior to your next appointment, please contact your pharmacy*  Follow-Up: Call back or seek an in-person evaluation if the symptoms worsen or if the condition fails to improve as anticipated.  [place] [phone]  Other Instructions Please keep well-hydrated and get plenty of rest. Start a saline nasal rinse to flush out your nasal passages. You can use plain Mucinex to help  thin congestion. Use the Flonase as directed.  Make sure to hold off on your Atorvastatin while taking the Paxlovid. If you have a humidifier, running in the bedroom at night. I want you to start OTC vitamin D3 1000 units daily, vitamin C 1000 mg daily, and a zinc supplement. Please take prescribed medications as directed.  You were to quarantine for 5 days from onset of your symptoms.  After day 5, if you have had no fever and you are feeling better, you can end quarantine but need to mask for an additional 5 days. After day 5 if you have a fever or are having significant symptoms, please quarantine for full 10 days.  If you note any worsening of symptoms, any significant shortness of breath or any chest pain, please seek ER evaluation ASAP.  Please do not delay care!  COVID-19: What to Do if You Are Sick If you test positive and are an older adult or someone who is at high risk of getting very sick from COVID-19, treatment may be available. Contact a healthcare provider right away after a positive test to determine if you are eligible, even if your symptoms are mild right now. You can also visit a Test to Treat location and, if eligible, receive a prescription from a provider. Don't delay: Treatment must be started within the first few days to be effective. If you have a fever, cough, or other symptoms, you might have COVID-19. Most people have mild illness and are able to recover at home. If you are  sick: Keep track of your symptoms. If you have an emergency warning sign (including trouble breathing), call 911. Steps to help prevent the spread of COVID-19 if you are sick If you are sick with COVID-19 or think you might have COVID-19, follow the steps below to care for yourself and to help protect other people in your home and community. Stay home except to get medical care Stay home. Most people with COVID-19 have mild illness and can recover at home without medical care. Do not leave your  home, except to get medical care. Do not visit public areas and do not go to places where you are unable to wear a mask. Take care of yourself. Get rest and stay hydrated. Take over-the-counter medicines, such as acetaminophen, to help you feel better. Stay in touch with your doctor. Call before you get medical care. Be sure to get care if you have trouble breathing, or have any other emergency warning signs, or if you think it is an emergency. Avoid public transportation, ride-sharing, or taxis if possible. Get tested If you have symptoms of COVID-19, get tested. While waiting for test results, stay away from others, including staying apart from those living in your household. Get tested as soon as possible after your symptoms start. Treatments may be available for people with COVID-19 who are at risk for becoming very sick. Don't delay: Treatment must be started early to be effective--some treatments must begin within 5 days of your first symptoms. Contact your healthcare provider right away if your test result is positive to determine if you are eligible. Self-tests are one of several options for testing for the virus that causes COVID-19 and may be more convenient than laboratory-based tests and point-of-care tests. Ask your healthcare provider or your local health department if you need help interpreting your test results. You can visit your state, tribal, local, and territorial health department's website to look for the latest local information on testing sites. Separate yourself from other people As much as possible, stay in a specific room and away from other people and pets in your home. If possible, you should use a separate bathroom. If you need to be around other people or animals in or outside of the home, wear a well-fitting mask. Tell your close contacts that they may have been exposed to COVID-19. An infected person can spread COVID-19 starting 48 hours (or 2 days) before the person has  any symptoms or tests positive. By letting your close contacts know they may have been exposed to COVID-19, you are helping to protect everyone. See COVID-19 and Animals if you have questions about pets. If you are diagnosed with COVID-19, someone from the health department may call you. Answer the call to slow the spread. Monitor your symptoms Symptoms of COVID-19 include fever, cough, or other symptoms. Follow care instructions from your healthcare provider and local health department. Your local health authorities may give instructions on checking your symptoms and reporting information. When to seek emergency medical attention Look for emergency warning signs* for COVID-19. If someone is showing any of these signs, seek emergency medical care immediately: Trouble breathing Persistent pain or pressure in the chest New confusion Inability to wake or stay awake Pale, gray, or blue-colored skin, lips, or nail beds, depending on skin tone *This list is not all possible symptoms. Please call your medical provider for any other symptoms that are severe or concerning to you. Call 911 or call ahead to your local emergency facility: Notify the  operator that you are seeking care for someone who has or may have COVID-19. Call ahead before visiting your doctor Call ahead. Many medical visits for routine care are being postponed or done by phone or telemedicine. If you have a medical appointment that cannot be postponed, call your doctor's office, and tell them you have or may have COVID-19. This will help the office protect themselves and other patients. If you are sick, wear a well-fitting mask You should wear a mask if you must be around other people or animals, including pets (even at home). Wear a mask with the best fit, protection, and comfort for you. You don't need to wear the mask if you are alone. If you can't put on a mask (because of trouble breathing, for example), cover your coughs and  sneezes in some other way. Try to stay at least 6 feet away from other people. This will help protect the people around you. Masks should not be placed on young children under age 28 years, anyone who has trouble breathing, or anyone who is not able to remove the mask without help. Cover your coughs and sneezes Cover your mouth and nose with a tissue when you cough or sneeze. Throw away used tissues in a lined trash can. Immediately wash your hands with soap and water for at least 20 seconds. If soap and water are not available, clean your hands with an alcohol-based hand sanitizer that contains at least 60% alcohol. Clean your hands often Wash your hands often with soap and water for at least 20 seconds. This is especially important after blowing your nose, coughing, or sneezing; going to the bathroom; and before eating or preparing food. Use hand sanitizer if soap and water are not available. Use an alcohol-based hand sanitizer with at least 60% alcohol, covering all surfaces of your hands and rubbing them together until they feel dry. Soap and water are the best option, especially if hands are visibly dirty. Avoid touching your eyes, nose, and mouth with unwashed hands. Handwashing Tips Avoid sharing personal household items Do not share dishes, drinking glasses, cups, eating utensils, towels, or bedding with other people in your home. Wash these items thoroughly after using them with soap and water or put in the dishwasher. Clean surfaces in your home regularly Clean and disinfect high-touch surfaces (for example, doorknobs, tables, handles, light switches, and countertops) in your "sick room" and bathroom. In shared spaces, you should clean and disinfect surfaces and items after each use by the person who is ill. If you are sick and cannot clean, a caregiver or other person should only clean and disinfect the area around you (such as your bedroom and bathroom) on an as needed basis. Your  caregiver/other person should wait as long as possible (at least several hours) and wear a mask before entering, cleaning, and disinfecting shared spaces that you use. Clean and disinfect areas that may have blood, stool, or body fluids on them. Use household cleaners and disinfectants. Clean visible dirty surfaces with household cleaners containing soap or detergent. Then, use a household disinfectant. Use a product from [employer] List N: Disinfectants for Coronavirus (AYTKZ-60). Be sure to follow the instructions on the label to ensure safe and effective use of the product. Many products recommend keeping the surface wet with a disinfectant for a certain period of time (look at "contact time" on the product label). You may also need to wear personal protective equipment, such as gloves, depending on the directions on the  product label. Immediately after disinfecting, wash your hands with soap and water for 20 seconds. For completed guidance on cleaning and disinfecting your home, visit Complete Disinfection Guidance. Take steps to improve ventilation at home Improve ventilation (air flow) at home to help prevent from spreading COVID-19 to other people in your household. Clear out COVID-19 virus particles in the air by opening windows, using air filters, and turning on fans in your home. Use this interactive tool to learn how to improve air flow in your home. When you can be around others after being sick with COVID-19 Deciding when you can be around others is different for different situations. Find out when you can safely end home isolation. For any additional questions about your care, contact your healthcare provider or state or local health department. 08/20/2020 Content source: [facility] ([facility]), Division of Viral Diseases This information is not intended to replace advice given to you by your health care provider. Make sure you discuss any  questions you have with your health care provider. Document Revised: 10/03/2020 Document Reviewed: 10/03/2020 Elsevier Patient Education  2022 [employer].   If you have been instructed to have an in-person evaluation today at a local Urgent Care facility, please use the link below. It will take you to a list of all of our available [facility], including address, phone number and hours of operation. Please do not delay care.  [facility]  If you or a family member do not have a primary care provider, use the link below to schedule a visit and establish care. When you choose a [facility] primary care physician or advanced practice provider, you gain a long-term partner in health. Find a Primary Care Provider  Learn more about [facility] in-office and virtual care options: [facility]

## 2022-07-08 NOTE — Progress Notes (Signed)
Virtual Visit Consent   Briana Castillo, you are scheduled for a virtual visit with a [facility] provider today. Just as with appointments in the office, your consent must be obtained to participate. Your consent will be active for this visit and any virtual visit you may have with one of our providers in the next 365 days. If you have a MyChart account, a copy of this consent can be sent to you electronically.  As this is a virtual visit, video technology does not allow for your provider to perform a traditional examination. This may limit your provider's ability to fully assess your condition. If your provider identifies any concerns that need to be evaluated in person or the need to arrange testing (such as labs, EKG, etc.), we will make arrangements to do so. Although advances in technology are sophisticated, we cannot ensure that it will always work on either your end or our end. If the connection with a video visit is poor, the visit may have to be switched to a telephone visit. With either a video or telephone visit, we are not always able to ensure that we have a secure connection.  By engaging in this virtual visit, you consent to the provision of healthcare and authorize for your insurance to be billed (if applicable) for the services provided during this visit. Depending on your insurance coverage, you may receive a charge related to this service.  I need to obtain your verbal consent now. Are you willing to proceed with your visit today? Alyssia Heese has provided verbal consent on 07/08/2022 for a virtual visit (video or telephone). Briana Castillo, [place]  Date: 07/08/2022 4:32 PM  Virtual Visit via Video Note   I, Briana Castillo, connected with  Seylah Wernert  ([id number], 05-16-1961) on 07/08/22 at  4:30 PM EST by a video-enabled telemedicine application and verified that I am speaking with the correct person using two identifiers.  Location: Patient: Virtual Visit Location Patient:  Home Provider: Virtual Visit Location Provider: Home Office   I discussed the limitations of evaluation and management by telemedicine and the availability of in person appointments. The patient expressed understanding and agreed to proceed.    History of Present Illness: Briana Castillo is a 62 y.o. who identifies as a female who was assigned female at birth, and is being seen today for COVID-19. Endorses symptoms staring late night 2/3 into morning of 2/4 with scratchy throat, increased thirst. Sunday noted fatigue, sore throat and headache. Took COVID test then and was negative. Since then, symptoms continued and Monday night with worsening of congestion and cough. Retested for COVID at home this morning and positive. Denies fever, SOB, chest pain. Denies GI symptoms.   OTC -- Tylenol/Advil, Dayquil  Is finishing last day of Vancomycin for c diff infection. 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   HTN (hypertension) 12/25/2014   Spider veins 04/18/2013   Venous insufficiency (chronic) (peripheral) 12/08/2011   Varicose veins with pain 08/11/2011   Heart murmur 11/13/2010   Anxiety 10/17/2010   CORNEAL ABRASION,  LEFT 08/27/2008    Allergies:  Allergies  Allergen Reactions   Amoxicillin Shortness Of Breath and Rash    REACTION: Swelling   Celebrex [Celecoxib] Shortness Of Breath and Rash   Penicillins Shortness Of Breath and Rash   Medications:  Current Outpatient Medications:    vancomycin (VANCOCIN) 125 MG capsule, Take  by mouth., Disp: , Rfl:    ALPRAZolam (XANAX) 0.5 MG tablet, TAKE 1 TABLET BY MOUTH EVERY DAY AS NEEDED FOR ANXIETY, Disp: 45 tablet, Rfl: 0   atorvastatin (LIPITOR) 20 MG tablet, TAKE 1 TABLET BY MOUTH EVERYDAY AT BEDTIME. LAST REFILL. NEEDS LABS/APPT., Disp: 90 tablet, Rfl: 0   EPINEPHrine 0.3 mg/0.3 mL IJ SOAJ injection, INJECT 0.3 MLS (0.3 MG TOTAL) INTO THE MUSCLE ONCE FOR 1 DOSE., Disp: 1 each, Rfl: PRN   hydrochlorothiazide (HYDRODIURIL) 25 MG tablet, Take 1 tablet (25 mg total) by mouth daily., Disp: 90 tablet, Rfl: 3   loratadine (CLARITIN) 10 MG tablet, Take 10 mg by mouth daily., Disp: , Rfl:    Multiple Vitamins-Minerals (CENTRUM SILVER 50+WOMEN PO), Take by mouth., Disp: , Rfl:   Observations/Objective: Patient is well-developed, well-nourished in no acute distress.  Resting comfortably at home.  Head is normocephalic, atraumatic.  No labored breathing. Speech is clear and coherent with logical content.  Patient is alert and oriented at baseline.   Assessment and Plan: 1. COVID-19  Patient with multiple risk factors for complicated course of illness. Discussed risks/benefits of antiviral medications including most common potential ADRs. Patient voiced understanding and would like to proceed with antiviral medication. They are candidate for Paxlovid due to recent renal function with GFR > 60 and normal CR. Is going to hold statin while on the antiviral. Rx sent to pharmacy. Supportive measures, OTC medications and vitamin regimen reviewed. Quarantine reviewed in detail. Strict ER precautions discussed with patient.   Follow Up Instructions: I discussed the assessment and treatment plan with the patient. The patient was provided an opportunity to ask questions and all were answered. The patient agreed with the plan and demonstrated an understanding of the instructions.  A copy of instructions were sent to the patient via MyChart unless otherwise noted below.   The patient was  advised to call back or seek an in-person evaluation if the symptoms worsen or if the condition fails to improve as anticipated.  Time:  I spent 10 minutes with the patient via telehealth technology discussing the above problems/concerns.    Briana Rio, PA-C

## 2022-07-08 NOTE — Telephone Encounter (Signed)
Pt advised to do an E-visit

## 2022-08-06 DIAGNOSIS — H524 Presbyopia: Secondary | ICD-10-CM | POA: Diagnosis not present

## 2022-08-10 ENCOUNTER — Other Ambulatory Visit: Payer: Self-pay | Admitting: Family Medicine

## 2022-08-10 DIAGNOSIS — E782 Mixed hyperlipidemia: Secondary | ICD-10-CM

## 2022-08-11 ENCOUNTER — Other Ambulatory Visit: Payer: Self-pay | Admitting: *Deleted

## 2022-08-11 DIAGNOSIS — E782 Mixed hyperlipidemia: Secondary | ICD-10-CM

## 2022-08-11 MED ORDER — ATORVASTATIN CALCIUM 20 MG PO TABS: 20.0000 mg | ORAL_TABLET | Freq: Every day | ORAL | 3 refills | Status: DC

## 2022-08-14 ENCOUNTER — Other Ambulatory Visit: Payer: Self-pay | Admitting: *Deleted

## 2022-08-14 DIAGNOSIS — Z122 Encounter for screening for malignant neoplasm of respiratory organs: Secondary | ICD-10-CM

## 2022-08-14 DIAGNOSIS — Z87891 Personal history of nicotine dependence: Secondary | ICD-10-CM

## 2022-09-08 ENCOUNTER — Ambulatory Visit (INDEPENDENT_AMBULATORY_CARE_PROVIDER_SITE_OTHER): Payer: BC Managed Care – PPO | Admitting: Acute Care

## 2022-09-08 ENCOUNTER — Encounter: Payer: Self-pay | Admitting: Acute Care

## 2022-09-08 DIAGNOSIS — F17211 Nicotine dependence, cigarettes, in remission: Secondary | ICD-10-CM | POA: Diagnosis not present

## 2022-09-08 DIAGNOSIS — Z87891 Personal history of nicotine dependence: Secondary | ICD-10-CM

## 2022-09-08 NOTE — Patient Instructions (Signed)
Thank you for participating in the [facility]. It was our pleasure to meet you today. We will call you with the results of your scan within the next few days. Your scan will be assigned a Lung RADS category score by the physicians reading the scans.  This Lung RADS score determines follow up scanning.  See below for description of categories, and follow up screening recommendations. We will be in touch to schedule your follow up screening annually or based on recommendations of our providers. We will fax a copy of your scan results to your Primary Care Physician, or the physician who referred you to the program, to ensure they have the results. Please call the office if you have any questions or concerns regarding your scanning experience or results.  Our office number is [phone]. Please speak with Denise Phelps, RN. , or  Denise Buckner RN, They are  our Lung Cancer Screening RN.'s If They are unavailable when you call, Please leave a message on the voice mail. We will return your call at our earliest convenience.This voice mail is monitored several times a day.  Remember, if your scan is normal, we will scan you annually as long as you continue to meet the criteria for the program. (Age 50-80, Current smoker or smoker who has quit within the last 15 years). If you are a smoker, remember, quitting is the single most powerful action that you can take to decrease your risk of lung cancer and other pulmonary, breathing related problems. We know quitting is hard, and we are here to help.  Please let us know if there is anything we can do to help you meet your goal of quitting. If you are a former smoker, congratulations. We are proud of you! Remain smoke free! Remember you can refer friends or family members through the number above.  We will screen them to make sure they meet criteria for the program. Thank you for helping us take better care of you by  participating in Lung Screening.  You can receive free nicotine replacement therapy ( patches, gum or mints) by calling 1-800-QUIT NOW. Please call so we can get you on the path to becoming  a non-smoker. I know it is hard, but you can do this!  Lung RADS Categories:  Lung RADS 1: no nodules or definitely non-concerning nodules.  Recommendation is for a repeat annual scan in 12 months.  Lung RADS 2:  nodules that are non-concerning in appearance and behavior with a very low likelihood of becoming an active cancer. Recommendation is for a repeat annual scan in 12 months.  Lung RADS 3: nodules that are probably non-concerning , includes nodules with a low likelihood of becoming an active cancer.  Recommendation is for a 6-month repeat screening scan. Often noted after an upper respiratory illness. We will be in touch to make sure you have no questions, and to schedule your 6-month scan.  Lung RADS 4 A: nodules with concerning findings, recommendation is most often for a follow up scan in 3 months or additional testing based on our provider's assessment of the scan. We will be in touch to make sure you have no questions and to schedule the recommended 3 month follow up scan.  Lung RADS 4 B:  indicates findings that are concerning. We will be in touch with you to schedule additional diagnostic testing based on our provider's  assessment of the scan.  Other options for assistance in smoking cessation (   As covered by your insurance benefits)  Hypnosis for smoking cessation  [employer] [phone]  Acupuncture for smoking cessation  [facility] [phone]   

## 2022-09-08 NOTE — Progress Notes (Signed)
Virtual Visit via Telephone Note  I connected with Briana Castillo on 09/08/22 at  3:30 PM EDT by telephone and verified that I am speaking with the correct person using two identifiers.  Location: Patient:  At home Provider: [address], [place], [place], [address]    I discussed the limitations, risks, security and privacy concerns of performing an evaluation and management service by telephone and the availability of in person appointments. I also discussed with the patient that there may be a patient responsible charge related to this service. The patient expressed understanding and agreed to proceed.    Shared Decision Making Visit Lung Cancer Screening Program [phone])   Eligibility: Age 62 y.o. Pack Years Smoking History Calculation 43 pack year smoking  (# packs/per year x # years smoked) Recent History of coughing up blood  no Unexplained weight loss? no ( >Than 15 pounds within the last 6 months ) Prior History Lung / other cancer no (Diagnosis within the last 5 years already requiring surveillance chest CT Scans). Smoking Status Former Smoker Former Smokers: Years since quit: 3 years  Quit Date: 2021  Visit Components: Discussion included one or more decision making aids. yes Discussion included risk/benefits of screening. yes Discussion included potential follow up diagnostic testing for abnormal scans. yes Discussion included meaning and risk of over diagnosis. yes Discussion included meaning and risk of False Positives. yes Discussion included meaning of total radiation exposure. yes  Counseling Included: Importance of adherence to annual lung cancer LDCT screening. yes Impact of comorbidities on ability to participate in the program. yes Ability and willingness to under diagnostic treatment. yes  Smoking Cessation Counseling: Current Smokers:  Discussed importance of smoking cessation. yes Information about tobacco cessation classes and interventions  provided to patient. yes Patient provided with "ticket" for LDCT Scan. yes Symptomatic Patient. no  Counseling NA Diagnosis Code: Tobacco Use Z72.0 Asymptomatic Patient yes  Counseling (Intermediate counseling: > three minutes counseling) J6967 Former Smokers:  Discussed the importance of maintaining cigarette abstinence. yes Diagnosis Code: Personal History of Nicotine Dependence. E93.810 Information about tobacco cessation classes and interventions provided to patient. Yes Patient provided with "ticket" for LDCT Scan. yes Written Order for Lung Cancer Screening with LDCT placed in Epic. Yes (CT Chest Lung Cancer Screening Low Dose W/O CM) FBP1025 Z12.2-Screening of respiratory organs Z87.891-Personal history of nicotine dependence  I spent 25 minutes of face to face time/virtual visit time  with  Ms. Demerchant discussing the risks and benefits of lung cancer screening. We took the time to pause the power point at intervals to allow for questions to be asked and answered to ensure understanding. We discussed that she had taken the single most powerful action possible to decrease her risk of developing lung cancer when she quit smoking. I counseled her to remain smoke free, and to contact me if she ever had the desire to smoke again so that I can provide resources and tools to help support the effort to remain smoke free. We discussed the time and location of the scan, and that either  Abigail Miyamoto RN, Karlton Lemon, RN or I  or I will call / send a letter with the results within  24-72 hours of receiving them. She has the office contact information in the event she needs to speak with me,  she verbalized understanding of all of the above and had no further questions upon leaving the office.     I explained to the patient that there has  been a high incidence of coronary artery disease noted on these exams. I explained that this is a non-gated exam therefore degree or severity cannot be determined.  This patient is on statin therapy. I have asked the patient to follow-up with their PCP regarding any incidental finding of coronary artery disease and management with diet or medication as they feel is clinically indicated. The patient verbalized understanding of the above and had no further questions.   Pt. Has quit smoking cigarettes, but she does vape. I have counseled her to quit.   Bevelyn Ngo, NP 09/08/2022

## 2022-09-09 ENCOUNTER — Ambulatory Visit (INDEPENDENT_AMBULATORY_CARE_PROVIDER_SITE_OTHER): Payer: BC Managed Care – PPO

## 2022-09-09 DIAGNOSIS — Z122 Encounter for screening for malignant neoplasm of respiratory organs: Secondary | ICD-10-CM

## 2022-09-09 DIAGNOSIS — Z87891 Personal history of nicotine dependence: Secondary | ICD-10-CM

## 2022-09-11 ENCOUNTER — Other Ambulatory Visit: Payer: Self-pay | Admitting: Acute Care

## 2022-09-11 DIAGNOSIS — Z87891 Personal history of nicotine dependence: Secondary | ICD-10-CM

## 2022-09-11 DIAGNOSIS — Z122 Encounter for screening for malignant neoplasm of respiratory organs: Secondary | ICD-10-CM

## 2022-09-15 ENCOUNTER — Encounter: Payer: Self-pay | Admitting: Family Medicine

## 2022-09-15 ENCOUNTER — Telehealth: Payer: Self-pay | Admitting: Family Medicine

## 2022-09-15 DIAGNOSIS — I77810 Thoracic aortic ectasia: Secondary | ICD-10-CM | POA: Insufficient documentation

## 2022-09-15 DIAGNOSIS — I700 Atherosclerosis of aorta: Secondary | ICD-10-CM | POA: Insufficient documentation

## 2022-09-15 DIAGNOSIS — E782 Mixed hyperlipidemia: Secondary | ICD-10-CM

## 2022-09-15 MED ORDER — ATORVASTATIN CALCIUM 40 MG PO TABS: 40.0000 mg | ORAL_TABLET | Freq: Every day | ORAL | 3 refills | Status: DC

## 2022-09-15 NOTE — Telephone Encounter (Signed)
Patient advised.

## 2022-09-15 NOTE — Telephone Encounter (Signed)
Call pt: based on recent CT results they did note some atherosclerosis in the aorta and the coronoaries.  I would like to bump up your lipitor to  to better control cholesterol depost on the vessel walls.    Meds ordered this encounter  Medications   atorvastatin (LIPITOR) 40 MG tablet    Sig: Take 1 tablet (40 mg total) by mouth at bedtime.    Dispense:  90 tablet    Refill:  3

## 2022-09-28 DIAGNOSIS — K3189 Other diseases of stomach and duodenum: Secondary | ICD-10-CM | POA: Diagnosis not present

## 2022-09-28 DIAGNOSIS — K259 Gastric ulcer, unspecified as acute or chronic, without hemorrhage or perforation: Secondary | ICD-10-CM | POA: Diagnosis not present

## 2022-09-28 DIAGNOSIS — D123 Benign neoplasm of transverse colon: Secondary | ICD-10-CM | POA: Diagnosis not present

## 2022-09-28 DIAGNOSIS — K2950 Unspecified chronic gastritis without bleeding: Secondary | ICD-10-CM | POA: Diagnosis not present

## 2022-09-28 DIAGNOSIS — Z1211 Encounter for screening for malignant neoplasm of colon: Secondary | ICD-10-CM | POA: Diagnosis not present

## 2022-09-28 DIAGNOSIS — K219 Gastro-esophageal reflux disease without esophagitis: Secondary | ICD-10-CM | POA: Diagnosis not present

## 2022-09-28 LAB — HM COLONOSCOPY

## 2022-11-17 ENCOUNTER — Encounter: Payer: Self-pay | Admitting: Family Medicine

## 2023-01-05 ENCOUNTER — Other Ambulatory Visit: Payer: Self-pay | Admitting: Family Medicine

## 2023-01-05 DIAGNOSIS — Z1231 Encounter for screening mammogram for malignant neoplasm of breast: Secondary | ICD-10-CM

## 2023-01-06 DIAGNOSIS — Z1231 Encounter for screening mammogram for malignant neoplasm of breast: Secondary | ICD-10-CM

## 2023-01-26 ENCOUNTER — Encounter (HOSPITAL_BASED_OUTPATIENT_CLINIC_OR_DEPARTMENT_OTHER): Payer: Self-pay

## 2023-01-26 ENCOUNTER — Ambulatory Visit (HOSPITAL_BASED_OUTPATIENT_CLINIC_OR_DEPARTMENT_OTHER)
Admission: RE | Admit: 2023-01-26 | Discharge: 2023-01-26 | Disposition: A | Discharge status: Home / Self Care | Payer: BC Managed Care – PPO | Source: Ambulatory Visit | Attending: Family Medicine | Admitting: Family Medicine

## 2023-01-26 DIAGNOSIS — Z1231 Encounter for screening mammogram for malignant neoplasm of breast: Secondary | ICD-10-CM | POA: Insufficient documentation

## 2023-01-29 NOTE — Progress Notes (Signed)
Please call patient. Normal mammogram.  Repeat in 1 year.  

## 2023-07-02 ENCOUNTER — Ambulatory Visit: Payer: BC Managed Care – PPO | Admitting: Sports Medicine

## 2023-07-02 ENCOUNTER — Ambulatory Visit (INDEPENDENT_AMBULATORY_CARE_PROVIDER_SITE_OTHER): Payer: BC Managed Care – PPO

## 2023-07-02 DIAGNOSIS — M4312 Spondylolisthesis, cervical region: Secondary | ICD-10-CM | POA: Diagnosis not present

## 2023-07-02 DIAGNOSIS — M542 Cervicalgia: Secondary | ICD-10-CM | POA: Diagnosis not present

## 2023-07-02 DIAGNOSIS — M4802 Spinal stenosis, cervical region: Secondary | ICD-10-CM | POA: Diagnosis not present

## 2023-07-02 DIAGNOSIS — M5412 Radiculopathy, cervical region: Secondary | ICD-10-CM

## 2023-07-02 DIAGNOSIS — M47812 Spondylosis without myelopathy or radiculopathy, cervical region: Secondary | ICD-10-CM | POA: Diagnosis not present

## 2023-07-02 MED ORDER — PREDNISONE 50 MG PO TABS
ORAL_TABLET | ORAL | 0 refills | Status: DC
Start: 2023-07-02 — End: 2023-09-15

## 2023-07-02 NOTE — Progress Notes (Signed)
    Procedures performed today:    None.  Independent interpretation of notes and tests performed by another provider:   None.  Brief History, Exam, Impression, and Recommendations:    Right cervical radiculopathy Pleasant 63 year old female, she has had a few months of increasing pain in the right side of her neck, right trapezius with radiation down the right arm, she does have some numbness in her right index finger though she did have some trauma in the past that led to the numbness. No progressive weakness, no red flag symptoms, we discussed the likelihood of cervical spondylosis and degenerative disc disease, we discussed some ways to improve ergonomics at her workstation, we also discussed 5 days of prednisone, x-rays, home physical therapy, return to see me in about 6 weeks, MR for interventional planning if not better.    ____________________________________________ Ihor Austin. Benjamin Stain, M.D., ABFM., CAQSM., AME. Primary Care and Sports Medicine [facility]  Adjunct Professor of Family Medicine  [employer]  Restaurant manager, fast food

## 2023-07-02 NOTE — Assessment & Plan Note (Signed)
Pleasant 64 year old female, she has had a few months of increasing pain in the right side of her neck, right trapezius with radiation down the right arm, she does have some numbness in her right index finger though she did have some trauma in the past that led to the numbness. No progressive weakness, no red flag symptoms, we discussed the likelihood of cervical spondylosis and degenerative disc disease, we discussed some ways to improve ergonomics at her workstation, we also discussed 5 days of prednisone, x-rays, home physical therapy, return to see me in about 6 weeks, MR for interventional planning if not better.

## 2023-07-05 ENCOUNTER — Ambulatory Visit: Payer: BC Managed Care – PPO | Admitting: Family Medicine

## 2023-08-03 ENCOUNTER — Other Ambulatory Visit: Payer: Self-pay | Admitting: Family Medicine

## 2023-08-03 DIAGNOSIS — I10 Essential (primary) hypertension: Secondary | ICD-10-CM

## 2023-08-03 DIAGNOSIS — E782 Mixed hyperlipidemia: Secondary | ICD-10-CM

## 2023-08-05 ENCOUNTER — Telehealth: Payer: Self-pay

## 2023-08-05 NOTE — Telephone Encounter (Signed)
It was refilled today. 

## 2023-08-05 NOTE — Telephone Encounter (Signed)
 Copied from CRM [id number]. Topic: Clinical - Prescription Issue >> Aug 05, 2023 12:39 PM Nila Nephew wrote: Reason for CRM: Patient calling to request an update on her hydrochlorothiazide (HYDRODIURIL) 25 MG tablet refill. Patient states she was told by her pharmacy we did not reply.

## 2023-08-05 NOTE — Telephone Encounter (Signed)
 Requesting rx rf of hydrochlorothiazide 25mg   Last written 05/20/2022 Last OV 05/20/2022 with DR. Metheney Was seen by Dr T on 07/02/2023 Upcoming appt 08/12/2023 with Dr. Karie Schwalbe

## 2023-08-05 NOTE — Telephone Encounter (Signed)
 Please call pt and inform her that she is OVERDUE for fasting labs.

## 2023-08-06 NOTE — Telephone Encounter (Signed)
 Patient is on her way to work and will call back and schedule her appt

## 2023-08-10 ENCOUNTER — Telehealth: Payer: Self-pay

## 2023-08-10 NOTE — Telephone Encounter (Signed)
 Copied from CRM [id number]. Topic: Clinical - Request for Lab/Test Order >> Aug 06, 2023  2:52 PM Nila Nephew wrote: Reason for CRM: Patient calling to state that Synetta Fail called her and told her to schedule fasting labs. Patient requesting lab orders be placed so she can come do her labs.

## 2023-08-10 NOTE — Telephone Encounter (Signed)
 Patient scheduled.

## 2023-08-12 ENCOUNTER — Ambulatory Visit: Payer: BC Managed Care – PPO | Admitting: Sports Medicine

## 2023-08-12 DIAGNOSIS — M5412 Radiculopathy, cervical region: Secondary | ICD-10-CM

## 2023-08-12 MED ORDER — GABAPENTIN 300 MG PO CAPS
ORAL_CAPSULE | ORAL | 3 refills | Status: DC
Start: 2023-08-12 — End: 2024-03-02

## 2023-08-12 NOTE — Progress Notes (Signed)
    Procedures performed today:    None.  Independent interpretation of notes and tests performed by another provider:   None.  Brief History, Exam, Impression, and Recommendations:    Right cervical radiculopathy This is a pleasant 63 year old female, she has had several months of increasing pain right side of the neck, right trapezius, radiation down the right arm with numbness in the right index finger. No progressive weakness, no red flag symptoms, we treated her conservatively initially with home physical therapy, prednisone, x-rays that did show severe cervical degenerative disc disease per She has had some improvement with home PT, prednisone, but still has intermittent discomfort, she also endorses she is under a great deal of stress at home. We discussed the anatomy and natural history of cervical disc disease, she understands that there is no cure for this, we did set realistic expectations. In the spirit of avoiding invasive procedures such as epidurals we will hold off on MRI, and we will add gabapentin, she will do the up titration and return to see me in 6 weeks.  Chronic process with exacerbation and pharmacologic intervention  ____________________________________________ Ihor Austin. Benjamin Stain, M.D., ABFM., CAQSM., AME. Primary Care and Sports Medicine [facility]  Adjunct Professor of Family Medicine  [employer]  Restaurant manager, fast food

## 2023-08-12 NOTE — Assessment & Plan Note (Signed)
 This is a pleasant 63 year old female, she has had several months of increasing pain right side of the neck, right trapezius, radiation down the right arm with numbness in the right index finger. No progressive weakness, no red flag symptoms, we treated her conservatively initially with home physical therapy, prednisone, x-rays that did show severe cervical degenerative disc disease per She has had some improvement with home PT, prednisone, but still has intermittent discomfort, she also endorses she is under a great deal of stress at home. We discussed the anatomy and natural history of cervical disc disease, she understands that there is no cure for this, we did set realistic expectations. In the spirit of avoiding invasive procedures such as epidurals we will hold off on MRI, and we will add gabapentin, she will do the up titration and return to see me in 6 weeks.

## 2023-08-13 ENCOUNTER — Ambulatory Visit: Payer: BC Managed Care – PPO | Admitting: Sports Medicine

## 2023-08-18 ENCOUNTER — Telehealth: Payer: Self-pay

## 2023-08-18 ENCOUNTER — Telehealth: Payer: Self-pay | Admitting: Family Medicine

## 2023-08-18 DIAGNOSIS — I10 Essential (primary) hypertension: Secondary | ICD-10-CM

## 2023-08-18 DIAGNOSIS — E782 Mixed hyperlipidemia: Secondary | ICD-10-CM

## 2023-08-18 NOTE — Telephone Encounter (Signed)
 Pt wants her lab orders for her 4/16 appt to be put in so she can complete them.

## 2023-08-18 NOTE — Telephone Encounter (Signed)
 Labs have been ordered. Please call pt and let her know. Thanks!

## 2023-08-18 NOTE — Telephone Encounter (Signed)
 Copied from CRM [id number]. Topic: General - Call Back - No Documentation >> Aug 18, 2023  1:29 PM Briana Castillo wrote: Reason for CRM: Returned patient'Castillo call due to office being out on lunch to get sooner appt for labs. Spoke with [facility] and they stated they will give patient a callback. Informed patient and she stated she thought about it and she would like to leave all as is due to the office for her digestive appt may be testing something different than the labs she scheduled to fast for.

## 2023-08-24 DIAGNOSIS — R1012 Left upper quadrant pain: Secondary | ICD-10-CM | POA: Diagnosis not present

## 2023-08-24 DIAGNOSIS — R634 Abnormal weight loss: Secondary | ICD-10-CM | POA: Diagnosis not present

## 2023-08-24 DIAGNOSIS — K219 Gastro-esophageal reflux disease without esophagitis: Secondary | ICD-10-CM | POA: Diagnosis not present

## 2023-08-24 DIAGNOSIS — K279 Peptic ulcer, site unspecified, unspecified as acute or chronic, without hemorrhage or perforation: Secondary | ICD-10-CM | POA: Diagnosis not present

## 2023-08-25 DIAGNOSIS — K219 Gastro-esophageal reflux disease without esophagitis: Secondary | ICD-10-CM | POA: Diagnosis not present

## 2023-08-25 DIAGNOSIS — R1012 Left upper quadrant pain: Secondary | ICD-10-CM | POA: Diagnosis not present

## 2023-08-25 DIAGNOSIS — K279 Peptic ulcer, site unspecified, unspecified as acute or chronic, without hemorrhage or perforation: Secondary | ICD-10-CM | POA: Diagnosis not present

## 2023-09-09 ENCOUNTER — Telehealth: Payer: Self-pay | Admitting: *Deleted

## 2023-09-09 NOTE — Telephone Encounter (Signed)
 Copied from CRM [id number]. Topic: Clinical - Medical Advice >> Sep 09, 2023  1:01 PM Hamdi H wrote: Reason for CRM: Patient had full lab work done a couple weeks ago at [facility]. She would like to wait until her appointment on 09/15/23 to get her fasting labs done if needed.

## 2023-09-09 NOTE — Telephone Encounter (Signed)
 Called and spoke with pt who stated she had extensive labwork that was ordered by digestive health provider about 2 weeks ago. States that the results are available on her mychart. Labs were done at [facility] so they should be able to be viewed on [facility] portal.  Stated to pt to show labwork that was ordered by digestive health to Dr. Linford Arnold at upcoming appt to see if she still wanted to have her get fasting labs done at same day as OV and told her if she did need to get labs done, that they could be done after the OV. Pt verbalized understanding. Nothing further needed.

## 2023-09-10 ENCOUNTER — Ambulatory Visit: Admitting: Family Medicine

## 2023-09-10 ENCOUNTER — Telehealth: Payer: Self-pay

## 2023-09-10 ENCOUNTER — Ambulatory Visit: Payer: Self-pay

## 2023-09-10 NOTE — Telephone Encounter (Signed)
 Chief Complaint: Leg pain  Symptoms: Left leg pain, back pain  Frequency: Constant  Pertinent Negatives: Patient denies swelling, injury  Disposition: [] ED /[] Urgent Care (no appt availability in office) / [x] Appointment(In office/virtual)/ []  [facility]/ [] Home Care/ [] Refused Recommended Disposition /[] [facility]/ []  Follow-up with PCP Additional Notes: Patient states she is experiencing left leg pain that ranges from a 8/10-10/10. Patient states she has not had this type of pain before and has not injured the leg. Patient reports taking ibuprofen and using a heating pad without much improvement. Care advice was given and patient has been scheduled for an acute virtual appointment  today.  Copied from CRM [id number]. Topic: Appointments - Appointment Scheduling >> Sep 10, 2023 10:26 AM Maree Krabbe H wrote: Patient called and has been experiencing pain in her left leg and it seems like the pain is traveling up to her back, when the patient is walking a shooting pain will happen and patient almost will lose her balance, she is not for sure if its a nerve pain and she wants to know what she needs to do, patient is wanting to speak to provider about this issue, on a pain level 1-10 pain is a 7-8 and sometimes its a 10. Patient feels the pain more when she is standing and walking, also when she goes from a sitting position to standing up or even when she gets in her car. She is experiencing some extreme pain. Reason for Disposition  [1] SEVERE pain (e.g., excruciating, unable to do any normal activities) AND [2] not improved after 2 hours of pain medicine  Answer Assessment - Initial Assessment Questions 1. ONSET: "When did the pain start?"      2 weeks ago, worsening the last few days 2. LOCATION: "Where is the pain located?"      Left leg pain  3. PAIN: "How bad is the pain?"    (Scale 1-10; or mild, moderate, severe)   -  MILD (1-3): doesn't interfere with normal activities     -  MODERATE (4-7): interferes with normal activities (e.g., work or school) or awakens from sleep, limping    -  SEVERE (8-10): excruciating pain, unable to do any normal activities, unable to walk     8/10 4. WORK OR EXERCISE: "Has there been any recent work or exercise that involved this part of the body?"      No  5. CAUSE: "What do you think is causing the leg pain?"     Unsure  6. OTHER SYMPTOMS: "Do you have any other symptoms?" (e.g., chest pain, back pain, breathing difficulty, swelling, rash, fever, numbness, weakness)    lower Back pain, shooting pain, unsteady gait, hip pain  7. PREGNANCY: "Is there any chance you are pregnant?" "When was your last menstrual period?"     N/A  Protocols used: Leg Pain-A-AH

## 2023-09-10 NOTE — Telephone Encounter (Signed)
 Patient scheduled for a virtual visit with Dr. Ermalene Searing for leg pain.  Provider unable to complete an assessment/evaluation of this issue virtually.  Patient called and offered assistance scheduling an in-person visit.  Patient reported that she has an appointment scheduled with PCP on Wednesday and will wait until that appointment.

## 2023-09-15 ENCOUNTER — Encounter: Payer: Self-pay | Admitting: Family Medicine

## 2023-09-15 ENCOUNTER — Ambulatory Visit: Admitting: Family Medicine

## 2023-09-15 ENCOUNTER — Ambulatory Visit

## 2023-09-15 VITALS — BP 128/76 | HR 58 | Ht 67.0 in | Wt 198.0 lb

## 2023-09-15 DIAGNOSIS — R7989 Other specified abnormal findings of blood chemistry: Secondary | ICD-10-CM | POA: Diagnosis not present

## 2023-09-15 DIAGNOSIS — M25552 Pain in left hip: Secondary | ICD-10-CM

## 2023-09-15 DIAGNOSIS — M1612 Unilateral primary osteoarthritis, left hip: Secondary | ICD-10-CM | POA: Diagnosis not present

## 2023-09-15 DIAGNOSIS — E876 Hypokalemia: Secondary | ICD-10-CM | POA: Diagnosis not present

## 2023-09-15 DIAGNOSIS — I10 Essential (primary) hypertension: Secondary | ICD-10-CM

## 2023-09-15 DIAGNOSIS — E782 Mixed hyperlipidemia: Secondary | ICD-10-CM

## 2023-09-15 DIAGNOSIS — I700 Atherosclerosis of aorta: Secondary | ICD-10-CM | POA: Diagnosis not present

## 2023-09-15 DIAGNOSIS — F419 Anxiety disorder, unspecified: Secondary | ICD-10-CM | POA: Diagnosis not present

## 2023-09-15 DIAGNOSIS — R7401 Elevation of levels of liver transaminase levels: Secondary | ICD-10-CM | POA: Diagnosis not present

## 2023-09-15 NOTE — Patient Instructions (Signed)
 Hold off on the Advil and Aleve until we recheck renal function.

## 2023-09-15 NOTE — Assessment & Plan Note (Signed)
 Well controlled. Continue current regimen. Follow up in  6 mo

## 2023-09-15 NOTE — Assessment & Plan Note (Signed)
 Due for updated labs. Continue statin.

## 2023-09-15 NOTE — Assessment & Plan Note (Signed)
 Stable.  No changes to regimen today. Use benzo sparingly.

## 2023-09-15 NOTE — Progress Notes (Signed)
 Established Patient Office Visit  Subjective  Patient ID: Briana Castillo, female    DOB: [birth year]  Age: 63 y.o. MRN: [id number]  Chief Complaint  Patient presents with   Hyperlipidemia   Hypertension    HPI  F/U Anxiety - not on daily med but uses xanax prn.   Hyperlipidemia - tolerating stating well with no myalgias or significant side effects.  Lab Results  Component Value Date   CHOL 175 05/20/2022   HDL 87 05/20/2022   LDLCALC 68 05/20/2022   TRIG 110 05/20/2022   CHOLHDL 2.0 05/20/2022   She c/o of left hip pain for 2 weeks.  No trauma or injury.  She says painful when swings her leg forward and at night can have shotting pains at night that actually keep her awake.  She says most of the pain is along the groin crease anteriorly but occasionally when it is really bad it will radiate to the outer part of the hip where her bursa is into the posterior part of the hip.  Some days it is a little better and other days she is literally limping.  Been relying on heat, Advil and had restarted her gabapentin which she had been previously using for her neck.  She also had some lab work done with [facility] over it GI.  They did a metabolic panel and a CBC.  Her potassium was just borderline low at 3.4.  Her AST which is a liver enzyme was just slightly elevated at 41 but not in a concerning range.  But her serum creatinine was 1.06 which is up from her baseline around 0.8 with a GFR of 59.  Was told to hold NSAIDs at that time.     ROS    Objective:     BP 128/76   Pulse (!) 58   Ht 5\' 7"  (1.702 m)   Wt 198 lb (89.8 kg)   LMP 09/13/2011   SpO2 98%   BMI 31.01 kg/m    Physical Exam Vitals and nursing note reviewed.  Constitutional:      Appearance: Normal appearance.  HENT:     Head: Normocephalic and atraumatic.  Eyes:     Conjunctiva/sclera: Conjunctivae normal.  Cardiovascular:     Rate and Rhythm: Normal rate and regular rhythm.  Pulmonary:     Effort:  Pulmonary effort is normal.     Breath sounds: Normal breath sounds.  Musculoskeletal:     Comments: Hip, knee strength is 5/5.  Dec hip flexion to about 9O degrees.  Pain medially with internal rotation.  Pain laterally with external rotation.   Skin:    General: Skin is warm and dry.  Neurological:     Mental Status: She is alert.  Psychiatric:        Mood and Affect: Mood normal.     No results found for any visits on 09/15/23.    The 10-year ASCVD risk score (Arnett DK, et al., 2019) is: 4.4%    Assessment & Plan:   Problem List Items Addressed This Visit       Cardiovascular and Mediastinum   HTN (hypertension)   Well controlled. Continue current regimen. Follow up in  6 mo       Aortic atherosclerosis (HCC) - Primary   Due for updated labs. Continue statin.       Relevant Orders   CMP14+EGFR   Lipid panel   TSH + free T4     Other   Hyperlipidemia  Due to recheck lipids.   The 10-year ASCVD risk score (Arnett DK, et al., 2019) is: 4.4%   Values used to calculate the score:     Age: 46 years     Sex: Female     Is Non-Hispanic African American: No     Diabetic: No     Tobacco smoker: No     Systolic Blood Pressure: 128 mmHg     Is BP treated: Yes     HDL Cholesterol: 87 mg/dL     Total Cholesterol: 175 mg/dL       Relevant Orders   CMP14+EGFR   Lipid panel   TSH + free T4   Elevated TSH   Will recheck level.  Possible subclinical hypothyroid.   Lab Results  Component Value Date   TSH 5.55 (H) 04/15/2021         Relevant Orders   CMP14+EGFR   Lipid panel   TSH + free T4   Anxiety   Stable.  No changes to regimen today. Use benzo sparingly.       Other Visit Diagnoses       Left hip pain       Relevant Orders   DG Hip Unilat W OR W/O Pelvis 2-3 Views Left     Elevated AST (SGOT)       Relevant Orders   CMP14+EGFR   Lipid panel   TSH + free T4     Hypokalemia       Relevant Orders   CMP14+EGFR   Lipid panel   TSH + free  T4      Left hip pain  - will get xrays to evaluate for hip OA. Consider PT depending on findings.  Pain is keeping her up at night. Consider ortho referral as well.   Last labs showed some abnormalities including hypokalemia and In AST . Will recheck today.     Return in about 6 months (around 03/16/2024) for htn/hyperlidmia.    Duaine German, MD

## 2023-09-15 NOTE — Assessment & Plan Note (Signed)
 Due to recheck lipids.   The 10-year ASCVD risk score (Arnett DK, et al., 2019) is: 4.4%   Values used to calculate the score:     Age: 63 years     Sex: Female     Is Non-Hispanic African American: No     Diabetic: No     Tobacco smoker: No     Systolic Blood Pressure: 128 mmHg     Is BP treated: Yes     HDL Cholesterol: 87 mg/dL     Total Cholesterol: 175 mg/dL

## 2023-09-15 NOTE — Assessment & Plan Note (Signed)
 Will recheck level.  Possible subclinical hypothyroid.   Lab Results  Component Value Date   TSH 5.55 (H) 04/15/2021

## 2023-09-16 ENCOUNTER — Encounter: Payer: Self-pay | Admitting: Family Medicine

## 2023-09-16 LAB — CMP14+EGFR
ALT: 24 IU/L (ref 0–32)
AST: 30 IU/L (ref 0–40)
Albumin: 4.1 g/dL (ref 3.9–4.9)
Alkaline Phosphatase: 89 IU/L (ref 44–121)
BUN/Creatinine Ratio: 18 (ref 12–28)
BUN: 16 mg/dL (ref 8–27)
Bilirubin Total: 0.6 mg/dL (ref 0.0–1.2)
CO2: 23 mmol/L (ref 20–29)
Calcium: 8.8 mg/dL (ref 8.7–10.3)
Chloride: 106 mmol/L (ref 96–106)
Creatinine, Ser: 0.91 mg/dL (ref 0.57–1.00)
Globulin, Total: 2.1 g/dL (ref 1.5–4.5)
Glucose: 90 mg/dL (ref 70–99)
Potassium: 3.9 mmol/L (ref 3.5–5.2)
Sodium: 142 mmol/L (ref 134–144)
Total Protein: 6.2 g/dL (ref 6.0–8.5)
eGFR: 71 mL/min/{1.73_m2} (ref >59)

## 2023-09-16 LAB — TSH+FREE T4
Free T4: 1 ng/dL (ref 0.82–1.77)
TSH: 4.34 u[IU]/mL (ref 0.450–4.500)

## 2023-09-16 LAB — LIPID PANEL
Chol/HDL Ratio: 2.7 ratio (ref 0.0–4.4)
Cholesterol, Total: 159 mg/dL (ref 100–199)
HDL: 58 mg/dL (ref >39)
LDL Chol Calc (NIH): 83 mg/dL (ref 0–99)
Triglycerides: 96 mg/dL (ref 0–149)
VLDL Cholesterol Cal: 18 mg/dL (ref 5–40)

## 2023-09-16 NOTE — Progress Notes (Signed)
 Audia, metabolic panel is normal cholesterol looks good.  LDL is under 100 which is good.  Continue daily atorvastatin.

## 2023-09-20 ENCOUNTER — Telehealth: Payer: Self-pay

## 2023-09-20 MED ORDER — FUROSEMIDE 20 MG PO TABS: 20.0000 mg | ORAL_TABLET | Freq: Every day | ORAL | 1 refills | Status: AC | PRN

## 2023-09-20 MED ORDER — ALPRAZOLAM 0.5 MG PO TABS: ORAL_TABLET | ORAL | 0 refills | Status: AC

## 2023-09-20 NOTE — Telephone Encounter (Signed)
 Copied from CRM [id number]. Topic: Clinical - Medication Question >> Sep 16, 2023  4:20 PM Dyann Glaser G wrote: Reason for CRM: PT REQUESTING TO SPEAK WITH DR METHENEY OR HER ASSISTANT ABOUT SOME ISSUES OTHER THAN WHAT HER APPT WAS ABOUT ON THIS WEEK. AND WANTED TO KNOW ABOUT A PRESCRIPTION REFILL ON HER ALPRAZolam  (XANAX ) 0.5 MG tablet.

## 2023-09-20 NOTE — Telephone Encounter (Signed)
 Patient informed.

## 2023-09-20 NOTE — Addendum Note (Signed)
 Addended by: Madylyn Insco D on: 09/20/2023 01:28 PM   Modules accepted: Orders

## 2023-09-20 NOTE — Telephone Encounter (Signed)
 Spoke with patient. States she  saw Dr. Greer Leak on 09/15/23-  states she has continuing leg pain  and bilateral ankle/feet swelling and is wanting to know if can be prescribed a fluid pill.

## 2023-09-20 NOTE — Telephone Encounter (Signed)
 Meds ordered this encounter  Medications   ALPRAZolam  (XANAX ) 0.5 MG tablet    Sig: TAKE 1 TABLET BY MOUTH EVERY DAY AS NEEDED FOR ANXIETY    Dispense:  45 tablet    Refill:  0    This request is for a new prescription for a controlled substance as required by Federal/State law.Aaron Aas

## 2023-09-20 NOTE — Telephone Encounter (Signed)
 Meds ordered this encounter  Medications   ALPRAZolam  (XANAX ) 0.5 MG tablet    Sig: TAKE 1 TABLET BY MOUTH EVERY DAY AS NEEDED FOR ANXIETY    Dispense:  45 tablet    Refill:  0    This request is for a new prescription for a controlled substance as required by Federal/State law..   furosemide  (LASIX ) 20 MG tablet    Sig: Take 1 tablet (20 mg total) by mouth daily as needed for edema or fluid.    Dispense:  20 tablet    Refill:  1

## 2023-09-20 NOTE — Addendum Note (Signed)
 Addended by: Sharma Lawrance D on: 09/20/2023 09:57 AM   Modules accepted: Orders

## 2023-09-22 ENCOUNTER — Telehealth: Payer: Self-pay

## 2023-09-22 NOTE — Telephone Encounter (Signed)
 Imagine not yet resulted.

## 2023-09-22 NOTE — Telephone Encounter (Signed)
 Call imaging and asked them to read the film today.

## 2023-09-22 NOTE — Telephone Encounter (Signed)
 Copied from CRM [id number]. Topic: General - Other >> Sep 22, 2023  9:04 AM Retta Caster wrote: Reason for CRM: DG Hip Unilat W OR W/O Pelvis 2-3 Views Left (Accession [id number]) (Order [id number] did this on 04/16 and has not heard from office on results. Still in pain and will like a call back on what the next step is. [phone]

## 2023-09-22 NOTE — Telephone Encounter (Signed)
 Called radiology reading room and had x-ray changed to STAT

## 2023-09-23 ENCOUNTER — Telehealth: Payer: Self-pay

## 2023-09-23 NOTE — Telephone Encounter (Signed)
 Copied from CRM [id number]. Topic: Clinical - Lab/Test Results >> Sep 22, 2023  3:56 PM Shelby Dessert H wrote: Reason for CRM: Patient called and wanted an update on her xrays and I informed the patient that Jullie Oiler did call radiology to get the xray changed to STAT and patient wants Jullie Oiler to call her as soon as she gets the results, patients callback number is  [phone] >> Sep 23, 2023  1:16 PM Karole Pacer C wrote: Patient is calling back to see if a clinical staff member could go over her xray results. Patient is looking for a better understanding of the results she seeing in mychart.

## 2023-09-23 NOTE — Telephone Encounter (Signed)
 Not yet reviewed by provider.

## 2023-09-24 ENCOUNTER — Ambulatory Visit: Payer: Self-pay

## 2023-09-24 ENCOUNTER — Ambulatory Visit: Admitting: Sports Medicine

## 2023-09-24 ENCOUNTER — Encounter: Payer: Self-pay | Admitting: Family Medicine

## 2023-09-24 DIAGNOSIS — R590 Localized enlarged lymph nodes: Secondary | ICD-10-CM | POA: Diagnosis not present

## 2023-09-24 DIAGNOSIS — K429 Umbilical hernia without obstruction or gangrene: Secondary | ICD-10-CM | POA: Diagnosis not present

## 2023-09-24 DIAGNOSIS — I7090 Unspecified atherosclerosis: Secondary | ICD-10-CM | POA: Diagnosis not present

## 2023-09-24 DIAGNOSIS — K219 Gastro-esophageal reflux disease without esophagitis: Secondary | ICD-10-CM | POA: Diagnosis not present

## 2023-09-24 MED ORDER — TRAMADOL HCL 50 MG PO TABS: 50.0000 mg | ORAL_TABLET | Freq: Three times a day (TID) | ORAL | 0 refills | Status: AC | PRN

## 2023-09-24 NOTE — Telephone Encounter (Signed)
 Called pt and gave following results.   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  No sign of necrosis of the bone which is great no sign of mass.  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  I think had like to get you in for formal physical therapy first for that hip and see if we cannot get some improvement if not improving then we will get you in with Ortho or sports med.  We have a great PT department here if that would be convenient for you or if there is somewhere closer to home or work then please let me know and we will get you there.  Let me know if you are okay with the referral.    Pt stated that she is really struggling with this pain because it is really affecting her quality of life. She wanted to know about seeing Dr. Elva Hamburger first about this to see what he could do for her because she's in so much pain.   Pt advised to schedule an appt w/Dr. T I transferred her to the front desk for scheduling.

## 2023-09-24 NOTE — Progress Notes (Signed)
 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  No sign of necrosis of the bone which is great no sign of mass.  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  I think had like to get you in for formal physical therapy first for that hip and see if we cannot get some improvement if not improving then we will get you in with Ortho or sports med.  We have a great PT department here if that would be convenient for you or if there is somewhere closer to home or work then please let me know and we will get you there.  Let me know if you are okay with the referral.

## 2023-09-24 NOTE — Telephone Encounter (Signed)
 Chief Complaint: lower back pain Symptoms: lower back pain Frequency: x 3 weeks  Pertinent Negatives: Patient denies  Disposition: [] ED /[] Urgent Care (no appt availability in office) / [x] Appointment(In office/virtual)/ []  [facility]/ [] Home Care/ [x] Refused Recommended Disposition /[] [facility]/ [x]  Follow-up with PCP Additional Notes: pt states that she has been having lower back pain that started in the groin area and her she can't get in with Dr. Elva Hamburger until Sep 30 2023. States Advil and gabapentin  and it does help some. States when pain is intense she can't even walk on it. States that she was instructed not to use NSAID if possible. Pt is requesting medication for pain relief.   Copied from CRM [id number]. Topic: Clinical - Red Word Triage >> Sep 24, 2023  9:12 AM Tisa Forester wrote: Red Word that prompted transfer to Nurse Triage: extreme pain in buttock ,lower back and hip groin hip upper leg  X ray I are in mychart patient requesting pain medication Reason for Disposition  Back pain present > 2 weeks  Answer Assessment - Initial Assessment Questions 1. ONSET: "When did the pain begin?"      3 weeks ago 2. LOCATION: "Where does it hurt?" (upper, mid or lower back)     Lower back 3. SEVERITY: "How bad is the pain?"  (e.g., Scale 1-10; mild, moderate, or severe)   - MILD (1-3): Doesn't interfere with normal activities.    - MODERATE (4-7): Interferes with normal activities or awakens from sleep.    - SEVERE (8-10): Excruciating pain, unable to do any normal activities.      Severe when occuring 4. PATTERN: "Is the pain constant?" (e.g., yes, no; constant, intermittent)      constant 5. RADIATION: "Does the pain shoot into your legs or somewhere else?"     Down legs 6. CAUSE:  "What do you think is causing the back pain?"      arthritis 7. BACK OVERUSE:  "Any recent lifting of heavy objects, strenuous work or exercise?"     no 8. MEDICINES: "What have you taken so  far for the pain?" (e.g., nothing, acetaminophen, NSAIDS)     nsaids 9. NEUROLOGIC SYMPTOMS: "Do you have any weakness, numbness, or problems with bowel/bladder control?"     no 10. OTHER SYMPTOMS: "Do you have any other symptoms?" (e.g., fever, abdomen pain, burning with urination, blood in urine)       no  Protocols used: Back Pain-A-AH

## 2023-09-24 NOTE — Telephone Encounter (Signed)
 Dressed in separate encounter.  I think there is 3 different notes open about this.

## 2023-09-24 NOTE — Progress Notes (Signed)
 Tramadol sent to pharmacy.  Okay to take with her Tylenol.

## 2023-09-30 ENCOUNTER — Encounter: Payer: Self-pay | Admitting: Sports Medicine

## 2023-09-30 ENCOUNTER — Ambulatory Visit (INDEPENDENT_AMBULATORY_CARE_PROVIDER_SITE_OTHER): Admitting: Sports Medicine

## 2023-09-30 ENCOUNTER — Other Ambulatory Visit (INDEPENDENT_AMBULATORY_CARE_PROVIDER_SITE_OTHER)

## 2023-09-30 DIAGNOSIS — M5412 Radiculopathy, cervical region: Secondary | ICD-10-CM

## 2023-09-30 DIAGNOSIS — M7062 Trochanteric bursitis, left hip: Secondary | ICD-10-CM

## 2023-09-30 MED ORDER — TRIAMCINOLONE ACETONIDE 40 MG/ML IJ SUSP
80.0000 mg | Freq: Once | INTRAMUSCULAR | Status: AC
  Administered 2023-09-30: 80 mg via INTRAMUSCULAR

## 2023-09-30 NOTE — Progress Notes (Signed)
    Procedures performed today:    Procedure: Real-time Ultrasound Guided injection of the left greater trochanteric bursa/hip abductors Device: Samsung HS60  Verbal informed consent obtained.  Time-out conducted.  Noted no overlying erythema, induration, or other signs of local infection.  Skin prepped in a sterile fashion.  Local anesthesia: Topical Ethyl chloride.  With sterile technique and under real time ultrasound guidance: 1 cc Kenalog  40, 2 cc lidocaine, 2 cc bupivacaine injected easily Completed without difficulty  Advised to call if fevers/chills, erythema, induration, drainage, or persistent bleeding.  Images permanently stored and available for review in PACS.  Impression: Technically successful ultrasound guided injection.  Procedure: Real-time Ultrasound Guided injection of the left hip joint Device: Samsung HS60  Verbal informed consent obtained.  Time-out conducted.  Noted no overlying erythema, induration, or other signs of local infection.  Skin prepped in a sterile fashion.  Local anesthesia: Topical Ethyl chloride.  With sterile technique and under real time ultrasound guidance: Minimally arthritic joint noted, 1 cc Kenalog  40, 2 cc lidocaine, 2 cc bupivacaine injected easily Completed without difficulty  Advised to call if fevers/chills, erythema, induration, drainage, or persistent bleeding.  Images permanently stored and available for review in PACS.  Impression: Technically successful ultrasound guided injection.  Independent interpretation of notes and tests performed by another provider:   None.  Brief History, Exam, Impression, and Recommendations:    Right cervical radiculopathy Pleasant 63 year old female, we initially treated her for right sided cervical radiculopathy with numbness down to the right index finger, she responded well to conservative treatment.   Left hip pain multifactorial Done also has chronic left hip pain, she locates the pain  both anteriorly in the groin and laterally, also posteriorly in the buttock. She does have difficulty laying on the ipsilateral side, on exam she has fairly good motion to internal rotation, she does have significant discomfort to palpation at the greater trochanter, as well as hip abductor weakness. She does note significant pain both in the groin and laterally, she did for me to treat her aggressively today so we did hip joint and hip abductor sheath/trochanteric bursa injections, I did inform her that the pain in her buttock was likely referred from her back, after the injection she did have some good relief of her lateral and anterior hip pain but still had pain in the groin, she will do the home conditioning and return to see me in 6 weeks and if persistent discomfort in the buttock we will address this with further investigation of her lumbar spine.    ____________________________________________ Briana Castillo. Briana Castillo, M.D., ABFM., CAQSM., AME. Primary Care and Sports Medicine [facility]  Adjunct Professor of [facility] Medicine  [employer]  Restaurant manager, fast food

## 2023-09-30 NOTE — Assessment & Plan Note (Addendum)
 Done also has chronic left hip pain, she locates the pain both anteriorly in the groin and laterally, also posteriorly in the buttock. She does have difficulty laying on the ipsilateral side, on exam she has fairly good motion to internal rotation, she does have significant discomfort to palpation at the greater trochanter, as well as hip abductor weakness. She does note significant pain both in the groin and laterally, she did for me to treat her aggressively today so we did hip joint and hip abductor sheath/trochanteric bursa injections, I did inform her that the pain in her buttock was likely referred from her back, after the injection she did have some good relief of her lateral and anterior hip pain but still had pain in the groin, she will do the home conditioning and return to see me in 6 weeks and if persistent discomfort in the buttock we will address this with further investigation of her lumbar spine.

## 2023-09-30 NOTE — Assessment & Plan Note (Signed)
 Pleasant 63 year old female, we initially treated her for right sided cervical radiculopathy with numbness down to the right index finger, she responded well to conservative treatment.

## 2023-10-05 DIAGNOSIS — R1013 Epigastric pain: Secondary | ICD-10-CM | POA: Diagnosis not present

## 2023-10-05 DIAGNOSIS — R197 Diarrhea, unspecified: Secondary | ICD-10-CM | POA: Diagnosis not present

## 2023-10-05 DIAGNOSIS — K219 Gastro-esophageal reflux disease without esophagitis: Secondary | ICD-10-CM | POA: Diagnosis not present

## 2023-11-02 ENCOUNTER — Other Ambulatory Visit: Payer: Self-pay

## 2023-11-02 DIAGNOSIS — I10 Essential (primary) hypertension: Secondary | ICD-10-CM

## 2023-11-02 DIAGNOSIS — E782 Mixed hyperlipidemia: Secondary | ICD-10-CM

## 2023-11-02 MED ORDER — ATORVASTATIN CALCIUM 40 MG PO TABS: 40.0000 mg | ORAL_TABLET | Freq: Every day | ORAL | 0 refills | Status: DC

## 2023-11-02 MED ORDER — HYDROCHLOROTHIAZIDE 25 MG PO TABS: 25.0000 mg | ORAL_TABLET | Freq: Every day | ORAL | 0 refills | Status: DC

## 2023-11-02 MED ORDER — EPINEPHRINE 0.3 MG/0.3ML IJ SOAJ: INTRAMUSCULAR | 99 refills | Status: AC

## 2023-11-02 NOTE — Telephone Encounter (Signed)
 Forwarding request to [facility] covering Dr. Greer Leak Refilled hydrochlorothiazide   and atorvastatin  for 90 day supply per protocol.  Patient also requesting a rx for epipen  Hers has expired on 09/2023. Last written 07/08/2022 Last OV 04/16/225 Upcoming appt 11/12/2023 with Dr. Elva Hamburger 03/15/2024 with Dr. Greer Leak

## 2023-11-02 NOTE — Telephone Encounter (Signed)
 Copied from CRM [id number]. Topic: Clinical - Medication Question >> Nov 02, 2023 11:01 AM Briana Castillo H wrote: Reason for CRM: Patient came in in April and didn't get her meds taken care of, she is needing a nurse or provider to call her and get her medicines put in for the year. She is wanting to know can the clinic just do it without her coming in. When she came in for her appointment they talked about her hip and lab and not the medicines. Patients callback number is [phone] hydrochlorothiazide  (HYDRODIURIL ) 25 MG tablet, atorvastatin  (LIPITOR) 40 MG tablet, EPINEPHrine  0.3 mg/0.3 mL IJ SOAJ injection

## 2023-11-12 ENCOUNTER — Ambulatory Visit: Admitting: Sports Medicine

## 2023-11-12 VITALS — Wt 180.0 lb

## 2023-11-12 DIAGNOSIS — M7062 Trochanteric bursitis, left hip: Secondary | ICD-10-CM

## 2023-11-12 NOTE — Assessment & Plan Note (Signed)
 Briana Castillo returns, she is a pleasant 63 year old female, chronic left hip pain, pain was localized with anteriorly in the groin and laterally, also some posteriorly in the buttock. She had some reproduction of pain with internal rotation as well as significant discomfort to palpation at the greater trochanter, hip abductor weakness. X-rays did show some hip joint spurring. We injected her trochanteric bursa/hip abductor's as well as her hip joint at the last visit, she did request aggressive treatment. She had really good relief of the anterior and the lateral pain, but still has some pain in the buttock. I was unable to reproduce the pain with palpation of the ischial tuberosity, she had mild discomfort over the SI joint on the left, and she did have some reproduction of the buttock pain with internal rotation of the hip. Overall her discomfort has improved considerably. I have asked her to get more consistent with her home conditioning, she has not really been doing that. At this point she can live with it, but if this worsens we can get an MRI of her hip.

## 2023-11-12 NOTE — Patient Instructions (Signed)
Hip Rehabilitation Protocol:  1.  Side leg raises.  3x30 with no weight, then 3x15 with 2 lb ankle weight, then 3x15 with 5 lb ankle weight 

## 2023-11-12 NOTE — Progress Notes (Signed)
    Procedures performed today:    None.  Independent interpretation of notes and tests performed by another provider:   None.  Brief History, Exam, Impression, and Recommendations:    Left hip pain multifactorial Briana Castillo, she is a pleasant 64 year old female, chronic left hip pain, pain was localized with anteriorly in the groin and laterally, also some posteriorly in the buttock. She had some reproduction of pain with internal rotation as well as significant discomfort to palpation at the greater trochanter, hip abductor weakness. X-rays did show some hip joint spurring. We injected her trochanteric bursa/hip abductor's as well as her hip joint at the last visit, she did request aggressive treatment. She had really good relief of the anterior and the lateral pain, but still has some pain in the buttock. I was unable to reproduce the pain with palpation of the ischial tuberosity, she had mild discomfort over the SI joint on the left, and she did have some reproduction of the buttock pain with internal rotation of the hip. Overall her discomfort has improved considerably. I have asked her to get more consistent with her home conditioning, she has not really been doing that. At this point she can live with it, but if this worsens we can get an MRI of her hip.  I spent 40 minutes of total time managing this patient today, this includes chart review, face to face, and non-face to face time.  The patient had several questions regarding supplements, vertigo, the questions were all answered.  ____________________________________________ Briana Castillo. Sandy Crumb, M.D., ABFM., CAQSM., AME. 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

## 2024-01-29 ENCOUNTER — Other Ambulatory Visit: Payer: Self-pay | Admitting: Family Medicine

## 2024-01-29 DIAGNOSIS — I10 Essential (primary) hypertension: Secondary | ICD-10-CM

## 2024-02-01 ENCOUNTER — Encounter: Payer: Self-pay | Admitting: Sports Medicine

## 2024-02-03 ENCOUNTER — Telehealth: Payer: Self-pay

## 2024-02-03 DIAGNOSIS — I10 Essential (primary) hypertension: Secondary | ICD-10-CM

## 2024-02-03 MED ORDER — HYDROCHLOROTHIAZIDE 25 MG PO TABS: 25.0000 mg | ORAL_TABLET | Freq: Every day | ORAL | 0 refills | Status: DC

## 2024-02-03 NOTE — Telephone Encounter (Signed)
 Copied from CRM #[id number]. Topic: Clinical - Prescription Issue >> Feb 03, 2024 11:52 AM Graeme ORN wrote: Reason for CRM: Patient called to check status of medication refill. Request submitted by Pharmacy still pending. Was told to reach out to provider. States she has upcoming appt 10/15 but not enough medication until then. hydrochlorothiazide  (HYDRODIURIL ) 25 MG tablet Thank You

## 2024-03-02 ENCOUNTER — Other Ambulatory Visit: Payer: Self-pay | Admitting: *Deleted

## 2024-03-02 DIAGNOSIS — M5412 Radiculopathy, cervical region: Secondary | ICD-10-CM

## 2024-03-02 MED ORDER — GABAPENTIN 300 MG PO CAPS: ORAL_CAPSULE | ORAL | 3 refills | Status: DC

## 2024-03-03 ENCOUNTER — Other Ambulatory Visit (HOSPITAL_BASED_OUTPATIENT_CLINIC_OR_DEPARTMENT_OTHER): Payer: Self-pay | Admitting: Family Medicine

## 2024-03-03 DIAGNOSIS — Z1231 Encounter for screening mammogram for malignant neoplasm of breast: Secondary | ICD-10-CM

## 2024-03-12 ENCOUNTER — Other Ambulatory Visit: Payer: Self-pay | Admitting: Family Medicine

## 2024-03-12 DIAGNOSIS — E782 Mixed hyperlipidemia: Secondary | ICD-10-CM

## 2024-03-14 ENCOUNTER — Ambulatory Visit (HOSPITAL_BASED_OUTPATIENT_CLINIC_OR_DEPARTMENT_OTHER)
Admission: RE | Admit: 2024-03-14 | Discharge: 2024-03-14 | Disposition: A | Discharge status: Home / Self Care | Source: Ambulatory Visit | Attending: Family Medicine | Admitting: Family Medicine

## 2024-03-14 ENCOUNTER — Encounter (HOSPITAL_BASED_OUTPATIENT_CLINIC_OR_DEPARTMENT_OTHER): Payer: Self-pay

## 2024-03-14 DIAGNOSIS — Z1231 Encounter for screening mammogram for malignant neoplasm of breast: Secondary | ICD-10-CM | POA: Diagnosis not present

## 2024-03-15 ENCOUNTER — Ambulatory Visit: Admitting: Family Medicine

## 2024-03-15 ENCOUNTER — Encounter: Payer: Self-pay | Admitting: Family Medicine

## 2024-03-15 VITALS — BP 128/84 | HR 71 | Ht 67.0 in | Wt 179.1 lb

## 2024-03-15 DIAGNOSIS — R011 Cardiac murmur, unspecified: Secondary | ICD-10-CM

## 2024-03-15 DIAGNOSIS — E782 Mixed hyperlipidemia: Secondary | ICD-10-CM

## 2024-03-15 DIAGNOSIS — M7062 Trochanteric bursitis, left hip: Secondary | ICD-10-CM

## 2024-03-15 DIAGNOSIS — M5412 Radiculopathy, cervical region: Secondary | ICD-10-CM | POA: Diagnosis not present

## 2024-03-15 DIAGNOSIS — I10 Essential (primary) hypertension: Secondary | ICD-10-CM | POA: Diagnosis not present

## 2024-03-15 DIAGNOSIS — R202 Paresthesia of skin: Secondary | ICD-10-CM | POA: Insufficient documentation

## 2024-03-15 MED ORDER — ATORVASTATIN CALCIUM 40 MG PO TABS: 40.0000 mg | ORAL_TABLET | Freq: Every day | ORAL | 3 refills | Status: AC

## 2024-03-15 NOTE — Assessment & Plan Note (Signed)
 Murmur is chronic.  Murmur is not new.

## 2024-03-15 NOTE — Assessment & Plan Note (Signed)
 We discussed doing some the labs initially just to rule out deficiencies such as B12 etc. she reports that she did start taking a B12 about a month ago.  If everything looks normal and reassuring then consider other potential causes she has had some neck issues so certainly that could be related.  Consider nerve conduction studies for further workup.

## 2024-03-15 NOTE — Assessment & Plan Note (Signed)
 She is better than she was previously but still having some persistent pain.  Did not have any success with the gabapentin .  Encouraged her to consider retrying the exercises she was unable to do them when she initially presented because of the severity of her pain but encouraged her to maybe try some of the stretches and incorporate those into some of the exercises she is already doing.  Can also consider referral back to sports med if needed since Dr. ONEIDA is no longer here.

## 2024-03-15 NOTE — Progress Notes (Signed)
 Established Patient Office Visit  Subjective  Patient ID: Briana Castillo, female    DOB: Sep 11, 1960  Age: 63 y.o. MRN: [id number]  Chief Complaint  Patient presents with   Hypertension    HPI She is here for follow-up and does have a couple of concerns today.  She saw me back in April for originally what was supposed to be a routine checkup but was having pretty severe left hip and groin pain at that time.  We did do images and then got him in with Dr. Curtis.  She ended up having an injection and did get some mild relief but she is continue to have persistent pain though she does feel like it is better than it was and she has a little bit more functional.  Dr. Curtis had recommended gabapentin  which she did try for a little while but felt like it really was not that helpful.  She has been walking a little bit more and trying to do some home stretches.  She also had a pretty severe case of COVID about 4 years ago where she was bedbound for almost 10 days.  And during that time had severe aching in both of her legs.  She says from time to time she will get that same sensation and most like things are coming back in her legs but then it does usually go away.  She has gotten some information online about a supplement called pro vitalized to help with menopausal symptoms and wonders if it could be helpful for her she has really been struggling with low energy.  Just not feeling motivated she says she does not really feel down or depressed.  She just does not feel like she has got the motivation some days it is a most hard to get out of bed.  Her stress levels have been particularly high over this last year as her mom went into hospice and then eventually passed away this summer.  She has also been dealing with some right neck pain.  She says that the stretches that she was given have really made a difference and have been helpful with the pain but in the last couple of months she has been  getting numbness and tingling in her fingertips in both hands.  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  He also noted about a 20 pound weight loss over the last year as well that she feels was unintended.   DG cervical spine July 02, 2023 Narrative & Impression  CLINICAL DATA:  Right-sided neck pain   EXAM: CERVICAL SPINE - COMPLETE 4+ VIEW   COMPARISON:  None Available.   FINDINGS: Straightening of the cervical spine. Trace anterolisthesis C4 on C5. Suboptimal visualization of cervicothoracic junction. Dens and lateral masses are grossly within normal limits but partially obscured by teeth. Moderate disc space narrowing at C3-C4 with advanced disc space narrowing C5-C6 and C6-C7. Bilateral foraminal narrowing C3 through C6   IMPRESSION: Straightening of the cervical spine with trace anterolisthesis C4 on C5. Multilevel degenerative changes, most advanced at C5-C6 and C6-C7      ROS    Objective:     BP 128/84   Pulse 71   Ht 5' 7 (1.702 m)   Wt 179 lb 1.9 oz (81.2 kg)   LMP 09/13/2011   SpO2 100%  BMI 28.05 kg/m    Physical Exam Vitals and nursing note reviewed.  Constitutional:      Appearance: Normal appearance.  HENT:     Head: Normocephalic and atraumatic.  Eyes:     Conjunctiva/sclera: Conjunctivae normal.  Cardiovascular:     Rate and Rhythm: Normal rate and regular rhythm.     Heart sounds: Murmur heard.  Pulmonary:     Effort: Pulmonary effort is normal.     Breath sounds: Normal breath sounds.  Musculoskeletal:     Cervical back: Neck supple. No tenderness.     Comments: NECK with normal flexion,extension, dec rotation left compared to right. Symmetric neck bending.   Skin:    General: Skin is warm and dry.  Neurological:     Mental Status: She is alert.  Psychiatric:        Mood and Affect: Mood normal.       No results found for any visits on 03/15/24.    The 10-year ASCVD risk score (Arnett DK, et al., 2019) is: 5.7%    Assessment & Plan:   Problem List Items Addressed This Visit       Cardiovascular and Mediastinum   HTN (hypertension)   Blood pressure overall looks good the diastolic is just a slightly above goal.  Will continue to monitor at home she is still taking hydrochlorothiazide  25 mg daily.  She is due for updated labs today.      Relevant Medications   atorvastatin  (LIPITOR) 40 MG tablet     Nervous and Auditory   Right cervical radiculopathy   Pain is much improved with at home exercises.  But she is getting numbness and tingling in her fingertips which is new.        Musculoskeletal and Integument   Left hip pain multifactorial   She is better than she was previously but still having some persistent pain.  Did not have any success with the gabapentin .  Encouraged her to consider retrying the exercises she was unable to do them when she initially presented because of the severity of her pain but encouraged her to maybe try some of the stretches and incorporate those into some of the exercises she is already doing.  Can also consider referral back to sports med if needed since Dr. ONEIDA is no longer here.        Other   Paresthesia of finger - Primary   We discussed doing some the labs initially just to rule out deficiencies such as B12 etc. she reports that she did start taking a B12 about a month ago.  If everything looks normal and reassuring then consider other potential causes she has had some neck issues so certainly that could be related.  Consider nerve conduction studies for further workup.      Relevant Orders   Vitamin B1   B12   Vitamin B6   TSH   CMP14+EGFR   VITAMIN D 25 Hydroxy (Vit-D Deficiency, Fractures)   Magnesium   Copper, serum   CBC with Differential/Platelet   Iron, TIBC and Ferritin Panel   Hyperlipidemia   Relevant Medications    atorvastatin  (LIPITOR) 40 MG tablet   Heart murmur   Murmur is chronic.  Murmur is not new.       Return in about 6 months (around 09/13/2024) for bp/cholesterol.   I spent 30 minutes on the day of the encounter to include pre-visit record review, face-to-face time with the patient and post visit ordering  of test.   Dorothyann Byars, MD

## 2024-03-15 NOTE — Assessment & Plan Note (Signed)
 Blood pressure overall looks good the diastolic is just a slightly above goal.  Will continue to monitor at home she is still taking hydrochlorothiazide  25 mg daily.  She is due for updated labs today.

## 2024-03-15 NOTE — Assessment & Plan Note (Signed)
 Pain is much improved with at home exercises.  But she is getting numbness and tingling in her fingertips which is new.

## 2024-03-17 ENCOUNTER — Ambulatory Visit: Admitting: Family Medicine

## 2024-03-17 ENCOUNTER — Ambulatory Visit: Payer: Self-pay | Admitting: Family Medicine

## 2024-03-17 NOTE — Progress Notes (Signed)
 Please call patient. Normal mammogram.  Repeat in 1 year.

## 2024-03-20 ENCOUNTER — Ambulatory Visit: Payer: Self-pay | Admitting: Family Medicine

## 2024-03-20 DIAGNOSIS — R202 Paresthesia of skin: Secondary | ICD-10-CM

## 2024-03-20 NOTE — Progress Notes (Signed)
 Usually we schedule with a neurologist.  Most the time I use [facility] in [facility] or [facility].  But we can use a different office if you would like.  It is called a nerve conduction study and it would be for the right and the left side.

## 2024-03-20 NOTE — Progress Notes (Signed)
 Hi Briana Castillo, blood count overall looks good no sign of anemia.  B12 looks good and B6 looks great.  Thyroid  level looks good.  Metabolic panel including liver and kidney function is normal.  Vitamin D looks great.  Magnesium and copper levels are normal.  Iron looks good too.  Vitamin B1 is still pending.  So far nothing specific to indicate a deficiency causing the numbness or tingling.  Next step would be to consider a nerve conduction study for further workup let me know if that something you would be okay with.

## 2024-03-21 LAB — COPPER, SERUM: Copper: 115 ug/dL (ref 80–158)

## 2024-03-21 LAB — CMP14+EGFR
ALT: 26 IU/L (ref 0–32)
AST: 35 IU/L (ref 0–40)
Albumin: 4.6 g/dL (ref 3.9–4.9)
Alkaline Phosphatase: 79 IU/L (ref 49–135)
BUN/Creatinine Ratio: 15 (ref 12–28)
BUN: 13 mg/dL (ref 8–27)
Bilirubin Total: 1 mg/dL (ref 0.0–1.2)
CO2: 23 mmol/L (ref 20–29)
Calcium: 9.7 mg/dL (ref 8.7–10.3)
Chloride: 103 mmol/L (ref 96–106)
Creatinine, Ser: 0.85 mg/dL (ref 0.57–1.00)
Globulin, Total: 1.8 g/dL (ref 1.5–4.5)
Glucose: 95 mg/dL (ref 70–99)
Potassium: 3.5 mmol/L (ref 3.5–5.2)
Sodium: 143 mmol/L (ref 134–144)
Total Protein: 6.4 g/dL (ref 6.0–8.5)
eGFR: 76 mL/min/1.73 (ref >59)

## 2024-03-21 LAB — VITAMIN B12: Vitamin B-12: 752 pg/mL (ref 232–1245)

## 2024-03-21 LAB — IRON,TIBC AND FERRITIN PANEL
Ferritin: 145 ng/mL (ref 15–150)
Iron Saturation: 20 % (ref 15–55)
Iron: 64 ug/dL (ref 27–139)
Total Iron Binding Capacity: 323 ug/dL (ref 250–450)
UIBC: 259 ug/dL (ref 118–369)

## 2024-03-21 LAB — CBC WITH DIFFERENTIAL/PLATELET
Basophils Absolute: 0.1 x10E3/uL (ref 0.0–0.2)
Basos: 1 %
EOS (ABSOLUTE): 0.2 x10E3/uL (ref 0.0–0.4)
Eos: 3 %
Hematocrit: 38.5 % (ref 34.0–46.6)
Hemoglobin: 12.8 g/dL (ref 11.1–15.9)
Immature Grans (Abs): 0 x10E3/uL (ref 0.0–0.1)
Immature Granulocytes: 0 %
Lymphocytes Absolute: 1.7 x10E3/uL (ref 0.7–3.1)
Lymphs: 33 %
MCH: 32.5 pg (ref 26.6–33.0)
MCHC: 33.2 g/dL (ref 31.5–35.7)
MCV: 98 fL — ABNORMAL HIGH (ref 79–97)
Monocytes Absolute: 0.4 x10E3/uL (ref 0.1–0.9)
Monocytes: 8 %
Neutrophils Absolute: 2.8 x10E3/uL (ref 1.4–7.0)
Neutrophils: 55 %
Platelets: 175 x10E3/uL (ref 150–450)
RBC: 3.94 x10E6/uL (ref 3.77–5.28)
RDW: 12.2 % (ref 11.7–15.4)
WBC: 5.1 x10E3/uL (ref 3.4–10.8)

## 2024-03-21 LAB — TSH: TSH: 3.9 u[IU]/mL (ref 0.450–4.500)

## 2024-03-21 LAB — VITAMIN B6: Vitamin B6: 21.6 ug/L (ref 3.4–65.2)

## 2024-03-21 LAB — VITAMIN D 25 HYDROXY (VIT D DEFICIENCY, FRACTURES): Vit D, 25-Hydroxy: 56.8 ng/mL (ref 30.0–100.0)

## 2024-03-21 LAB — VITAMIN B1: Thiamine: 134 nmol/L (ref 66.5–200.0)

## 2024-03-21 LAB — MAGNESIUM: Magnesium: 2 mg/dL (ref 1.6–2.3)

## 2024-03-21 NOTE — Progress Notes (Signed)
 HI Shawnee, your B1 came back in and it is normal so no deficiency there.  Also meant to ask you whether or not you are interested in maybe doing lung cancer screening with your prior history of smoking you would likely be a candidate.  If that something you are interested in let me know and I can connect you with our lung prevention screening nurse to do a review and make sure that insurance will cover before we proceed.  It is basically a low-dose CAT scan of the chest to pick up for any nodules or lung cancer.

## 2024-03-28 NOTE — Addendum Note (Signed)
 Addended by: Amy Gothard D on: 03/28/2024 07:29 AM   Modules accepted: Orders

## 2024-04-26 ENCOUNTER — Other Ambulatory Visit: Payer: Self-pay | Admitting: Family Medicine

## 2024-04-26 ENCOUNTER — Telehealth: Payer: Self-pay | Admitting: Family Medicine

## 2024-04-26 DIAGNOSIS — I10 Essential (primary) hypertension: Secondary | ICD-10-CM

## 2024-04-26 NOTE — Telephone Encounter (Signed)
 Copied from CRM #[id number]. Topic: Referral - Question >> Apr 26, 2024  3:16 PM Montie POUR wrote: Reason for CRM:  Briana Castillo would like a call back by Monday to discuss the Neurology referral. [employer] list different ones and the co pays are different. She might need a new referral to a different doctor. Her call back number is [phone]. Thanks

## 2024-04-26 NOTE — Telephone Encounter (Signed)
 Copied from CRM #[id number]. Topic: Clinical - Medication Refill >> Apr 26, 2024  3:20 PM Montie POUR wrote: Medication:  hydrochlorothiazide  (HYDRODIURIL ) 25 MG tablet  Has the patient contacted their pharmacy? Yes (Agent: If no, request that the patient contact the pharmacy for the refill. If patient does not wish to contact the pharmacy document the reason why and proceed with request.) (Agent: If yes, when and what did the pharmacy advise?) Pharmacy needs order to refill  This is the patient's preferred pharmacy:  [id number] - [place], [place] - [address] [facility] [address] [place] [address] Phone: [phone] Fax: [phone]  Is this the correct pharmacy for this prescription? Yes If no, delete pharmacy and type the correct one.   Has the prescription been filled recently? No  Is the patient out of the medication? No  Has the patient been seen for an appointment in the last year OR does the patient have an upcoming appointment? Yes  Can we respond through MyChart? Yes  Agent: Please be advised that Rx refills may take up to 3 business days. We ask that you follow-up with your pharmacy.

## 2024-04-30 ENCOUNTER — Other Ambulatory Visit: Payer: Self-pay | Admitting: Family Medicine

## 2024-04-30 DIAGNOSIS — I10 Essential (primary) hypertension: Secondary | ICD-10-CM

## 2024-05-02 MED ORDER — HYDROCHLOROTHIAZIDE 25 MG PO TABS: 25.0000 mg | ORAL_TABLET | Freq: Every day | ORAL | 0 refills | Status: AC

## 2024-09-13 ENCOUNTER — Ambulatory Visit: Admitting: Family Medicine
